# Patient Record
Sex: Female | Born: 1935 | Race: White | Hispanic: No | Marital: Married | Smoking: Never smoker
Health system: Southern US, Community
[De-identification: ages and names within clinical notes are randomized; demographics above are authoritative.]

## PROBLEM LIST (undated history)

## (undated) DIAGNOSIS — I341 Nonrheumatic mitral (valve) prolapse: Secondary | ICD-10-CM

## (undated) DIAGNOSIS — I10 Essential (primary) hypertension: Secondary | ICD-10-CM

## (undated) DIAGNOSIS — H409 Unspecified glaucoma: Secondary | ICD-10-CM

## (undated) HISTORY — PX: APPENDECTOMY: SHX54

## (undated) HISTORY — PX: CHOLECYSTECTOMY: SHX55

## (undated) HISTORY — PX: BREAST EXCISIONAL BIOPSY: SUR124

## (undated) HISTORY — PX: ABDOMINAL HYSTERECTOMY: SHX81

---

## 1965-11-20 HISTORY — PX: THYROIDECTOMY: SHX17

## 1999-02-14 ENCOUNTER — Ambulatory Visit (HOSPITAL_COMMUNITY): Admission: RE | Admit: 1999-02-14 | Discharge: 1999-02-14 | Payer: Self-pay | Admitting: *Deleted

## 1999-12-01 ENCOUNTER — Encounter: Payer: Self-pay | Admitting: Internal Medicine

## 1999-12-01 ENCOUNTER — Encounter: Admission: RE | Admit: 1999-12-01 | Discharge: 1999-12-01 | Payer: Self-pay | Admitting: Internal Medicine

## 2000-12-07 ENCOUNTER — Encounter: Admission: RE | Admit: 2000-12-07 | Discharge: 2000-12-07 | Payer: Self-pay | Admitting: Internal Medicine

## 2000-12-07 ENCOUNTER — Encounter: Payer: Self-pay | Admitting: Internal Medicine

## 2001-10-09 ENCOUNTER — Encounter: Admission: RE | Admit: 2001-10-09 | Discharge: 2001-10-09 | Payer: Self-pay | Admitting: Internal Medicine

## 2001-10-09 ENCOUNTER — Encounter: Payer: Self-pay | Admitting: Internal Medicine

## 2001-12-09 ENCOUNTER — Encounter: Payer: Self-pay | Admitting: Internal Medicine

## 2001-12-09 ENCOUNTER — Encounter: Admission: RE | Admit: 2001-12-09 | Discharge: 2001-12-09 | Payer: Self-pay | Admitting: Internal Medicine

## 2002-12-24 ENCOUNTER — Encounter: Payer: Self-pay | Admitting: Obstetrics and Gynecology

## 2002-12-24 ENCOUNTER — Encounter: Admission: RE | Admit: 2002-12-24 | Discharge: 2002-12-24 | Payer: Self-pay | Admitting: Obstetrics and Gynecology

## 2003-09-09 ENCOUNTER — Inpatient Hospital Stay (HOSPITAL_COMMUNITY): Admission: EM | Admit: 2003-09-09 | Discharge: 2003-09-13 | Payer: Self-pay | Admitting: Emergency Medicine

## 2003-09-09 ENCOUNTER — Encounter (INDEPENDENT_AMBULATORY_CARE_PROVIDER_SITE_OTHER): Payer: Self-pay | Admitting: Cardiology

## 2003-09-09 ENCOUNTER — Encounter: Payer: Self-pay | Admitting: Emergency Medicine

## 2003-09-09 ENCOUNTER — Encounter: Payer: Self-pay | Admitting: Internal Medicine

## 2003-11-27 ENCOUNTER — Encounter: Admission: RE | Admit: 2003-11-27 | Discharge: 2003-11-27 | Payer: Self-pay | Admitting: *Deleted

## 2003-12-28 ENCOUNTER — Encounter: Admission: RE | Admit: 2003-12-28 | Discharge: 2003-12-28 | Payer: Self-pay | Admitting: Internal Medicine

## 2005-01-19 ENCOUNTER — Encounter: Admission: RE | Admit: 2005-01-19 | Discharge: 2005-01-19 | Payer: Self-pay | Admitting: Internal Medicine

## 2005-09-04 ENCOUNTER — Other Ambulatory Visit: Admission: RE | Admit: 2005-09-04 | Discharge: 2005-09-04 | Payer: Self-pay | Admitting: Internal Medicine

## 2005-09-05 ENCOUNTER — Inpatient Hospital Stay (HOSPITAL_COMMUNITY): Admission: EM | Admit: 2005-09-05 | Discharge: 2005-09-07 | Payer: Self-pay | Admitting: Emergency Medicine

## 2005-12-01 ENCOUNTER — Encounter (INDEPENDENT_AMBULATORY_CARE_PROVIDER_SITE_OTHER): Payer: Self-pay | Admitting: Specialist

## 2005-12-01 ENCOUNTER — Ambulatory Visit (HOSPITAL_COMMUNITY): Admission: RE | Admit: 2005-12-01 | Discharge: 2005-12-01 | Payer: Self-pay | Admitting: *Deleted

## 2006-01-23 ENCOUNTER — Encounter: Admission: RE | Admit: 2006-01-23 | Discharge: 2006-01-23 | Payer: Self-pay | Admitting: Internal Medicine

## 2007-02-20 ENCOUNTER — Encounter: Admission: RE | Admit: 2007-02-20 | Discharge: 2007-02-20 | Payer: Self-pay | Admitting: Obstetrics and Gynecology

## 2008-01-09 ENCOUNTER — Ambulatory Visit (HOSPITAL_COMMUNITY): Admission: RE | Admit: 2008-01-09 | Discharge: 2008-01-09 | Payer: Self-pay | Admitting: *Deleted

## 2008-02-07 ENCOUNTER — Encounter: Admission: RE | Admit: 2008-02-07 | Discharge: 2008-02-07 | Payer: Self-pay | Admitting: *Deleted

## 2008-03-09 ENCOUNTER — Encounter: Admission: RE | Admit: 2008-03-09 | Discharge: 2008-03-09 | Payer: Self-pay | Admitting: Internal Medicine

## 2009-03-10 ENCOUNTER — Encounter: Admission: RE | Admit: 2009-03-10 | Discharge: 2009-03-10 | Payer: Self-pay | Admitting: Obstetrics and Gynecology

## 2010-03-11 ENCOUNTER — Encounter: Admission: RE | Admit: 2010-03-11 | Discharge: 2010-03-11 | Payer: Self-pay | Admitting: Internal Medicine

## 2010-03-31 ENCOUNTER — Emergency Department (HOSPITAL_BASED_OUTPATIENT_CLINIC_OR_DEPARTMENT_OTHER): Admission: EM | Admit: 2010-03-31 | Discharge: 2010-03-31 | Payer: Self-pay | Admitting: Emergency Medicine

## 2010-07-27 ENCOUNTER — Encounter: Admission: RE | Admit: 2010-07-27 | Discharge: 2010-07-27 | Payer: Self-pay | Admitting: Gastroenterology

## 2011-01-26 ENCOUNTER — Other Ambulatory Visit: Payer: Self-pay | Admitting: Internal Medicine

## 2011-01-26 DIAGNOSIS — R0989 Other specified symptoms and signs involving the circulatory and respiratory systems: Secondary | ICD-10-CM

## 2011-01-27 ENCOUNTER — Ambulatory Visit
Admission: RE | Admit: 2011-01-27 | Discharge: 2011-01-27 | Disposition: A | Discharge status: Home / Self Care | Payer: Medicare Other | Source: Ambulatory Visit | Attending: Internal Medicine | Admitting: Internal Medicine

## 2011-01-27 DIAGNOSIS — R0989 Other specified symptoms and signs involving the circulatory and respiratory systems: Secondary | ICD-10-CM

## 2011-02-06 ENCOUNTER — Other Ambulatory Visit: Payer: Self-pay | Admitting: Internal Medicine

## 2011-02-06 DIAGNOSIS — Z1231 Encounter for screening mammogram for malignant neoplasm of breast: Secondary | ICD-10-CM

## 2011-03-14 ENCOUNTER — Ambulatory Visit
Admission: RE | Admit: 2011-03-14 | Discharge: 2011-03-14 | Disposition: A | Discharge status: Home / Self Care | Payer: Medicare Other | Source: Ambulatory Visit | Attending: Internal Medicine | Admitting: Internal Medicine

## 2011-03-14 DIAGNOSIS — Z1231 Encounter for screening mammogram for malignant neoplasm of breast: Secondary | ICD-10-CM

## 2011-04-04 NOTE — Op Note (Signed)
NAMEPHILISHA, WEINEL NO.:  [id number]   MEDICAL RECORD NO.:  [id number]          PATIENT TYPE:  AMB   LOCATION:  ENDO                         FACILITY:  [facility]   PHYSICIAN:  Georgiana Spinner, M.D.    DATE OF BIRTH:  02/18/1931   DATE OF PROCEDURE:  01/09/2008  DATE OF DISCHARGE:                               OPERATIVE REPORT   PROCEDURE:  Colonoscopy attempt.   ANESTHESIA:  Fentanyl 75 mcg, Versed 6 mg.   PROCEDURE:  With the patient mildly sedated in the left lateral  decubitus position, the Pentax videoscopic colonoscope was inserted the  rectum and passed under direct vision to the sigmoid colon and at the  turn where the colon left the pelvis I could not make the turn so I  withdrew the colonoscope and inserted a pediatric colonoscope was which  went to the same level.  I elected to withdraw taking circumferential  views remaining colonic mucosa.  The endoscope was placed in  retroflexion to view the anal canal from above.  Internal hemorrhoids  were seen.  The endoscope was straightened, withdrawn.  The patient's  vital signs, pulse oximeter remained stable.  The patient tolerated  procedure well without apparent complication.   FINDINGS:  Internal hemorrhoids otherwise unremarkable examination.   PLAN:  Proceed to barium enema for complete evaluation.           ______________________________  Georgiana Spinner, M.D.     GMO/MEDQ  D:  01/09/2008  T:  01/10/2008  Job:  [id number]

## 2011-04-07 NOTE — Consult Note (Signed)
NAME:  Tammy Hampton, Tammy Hampton                       ACCOUNT NO.:  [id number]   MEDICAL RECORD NO.:  [id number]                   PATIENT TYPE:  INP   LOCATION:  5502                                 FACILITY:  [facility]   PHYSICIAN:  Doylene Canning. Ladona Ridgel, M.D.               DATE OF BIRTH:  09-28-1931   DATE OF CONSULTATION:  09/10/2003  DATE OF DISCHARGE:                                   CONSULTATION   ELECTROPHYSIOLOGY CONSULT NOTE   REASON FOR CONSULTATION:  The consultation was requested by Dr. Miles Costain  for evaluation of syncope.   HISTORY OF PRESENT ILLNESS:  The patient is a very pleasant 77 year old  woman with a history of remote syncope in the past, a history of mitral  valve prolapse, who was admitted to the hospital with complaints of  headache, chills, and chest pain.  The patient had no evidence of myocardial  injury on serial cardiac enzymes.  She has baseline left bundle branch  block.  She had been fairly stable with resolution of her chest pain, but  continuation of the fevers and chills with a CT scan of the head showing  sinusitis by report.  She was up at the sink last night when she turned to  walk back to her room and passed out.  Apparently she was unconscious for  several minutes.  There was minimal, if any, warning.  After the episode,  she felt weak and tired.  She denied any associated chest pain, shortness of  breath, diaphoresis, nausea, or vomiting.  She was referred for additional  evaluation.   PAST MEDICAL HISTORY:  As previously noted.  In addition to the history of  glaucoma, she is status post thyroidectomy for thyroid cancer several years  ago, and is now on Synthroid replacement.  She has had a history of a  thallium stress test several years ago.  Apparently all of these were  normal.   PAST SURGICAL HISTORY:  1. Thyroidectomy.  2. Appendectomy.  3. Hysterectomy.  4. Cholecystectomy.  5. She is status post repair of rectocele and a cystocele.   FAMILY HISTORY:  Notable for glaucoma and diabetes.   SOCIAL HISTORY:  The patient is married but has no children.  She denies  tobacco or ethanol use.   PHYSICAL EXAMINATION:  GENERAL:  She is a pleasant, well-appearing 72-year-  old woman in no distress.  VITAL SIGNS:  Blood pressure was 119/55, pulse 81 and regular, temperature  98.7, respirations 18.  HEENT:  Normocephalic and atraumatic.  Pupils were equal and round.  Oropharynx was moist.  The sclerae are anicteric.  NECK:  No jugular venous distention.  There was no thyromegaly.  The trachea  was midline.  There were no bruits.  CARDIOVASCULAR:  Regular rate and rhythm with normal S1 and S2.  There were  no murmurs, rubs, or gallops.  LUNGS:  Demonstrated no wheezing, rales, or rhonchi  bilaterally.  ABDOMEN:  Soft, nontender, nondistended.  There was no organomegaly.  NEUROLOGIC:  She is alert an oriented x3 with cranial nerves II-XII grossly  intact.  EXTREMITIES:  Demonstrated 1 to 2+ pulses bilaterally without edema.   LABORATORY AND ACCESSORY DATA:  EKG demonstrates normal sinus rhythm with a  short PR interval and left bundle branch block.  Labs demonstrated no  myocardial injury with a normal TSH.   IMPRESSION:  1. Unexplained syncope.  2. Left bundle branch block.  3. Chest pain of unknown etiology.  4. Headache with evidence of sinusitis by CT scan of the head.   DISCUSSION:  I have reviewed the patient's rhythm strips while she had her  syncopal episode, and there was no brady or tachyarrhythmias noted.  This  strongly suggests an orthostatic mechanism to explain her syncope.  I have  recommended that she continue to be monitored on telemetry.  I have also  recommended that orthostatic vitals be recorded several times a day (q  shift).  Would consider Cardiolite stress testing, unless this has been done  recently, although I do not think coronary disease is the cause of her  syncope.  I wonder if somehow  sinusitis might involve her vestibular system,  resulting in dizziness, followed by syncope, although this seems unlikely.  Head up tilt table testing would be a consideration, particularly in light  of all of her above symptoms, particularly if the left ventricular function  is normal.  Will follow up with the results of this.                                               Doylene Canning. Ladona Ridgel, M.D.    GWT/MEDQ  D:  09/10/2003  T:  09/10/2003  Job:  [id number]   cc:   Miles Costain, M.D.   Jaclyn Prime. Lucas Mallow, M.D.  [address]  [place]  [place] [address]  Fax: [phone]

## 2011-04-07 NOTE — Op Note (Signed)
NAMEMarland Kitchen  Tammy Hampton, CHIUSANO NO.:  [id number]   MEDICAL RECORD NO.:  [id number]          PATIENT TYPE:  AMB   LOCATION:  ENDO                         FACILITY:  [facility]   PHYSICIAN:  Georgiana Spinner, M.D.    DATE OF BIRTH:  [birth year]   DATE OF PROCEDURE:  DATE OF DISCHARGE:                                 OPERATIVE REPORT   PROCEDURE:  Colonoscopy with polypectomy.   INDICATIONS:  Colon polyps.   ANESTHESIA:  Demerol 50, Versed 4 mg.   PROCEDURE:  With the patient mildly sedated in the left lateral decubitus  position, the Olympus videoscopic pediatric colonoscope, PCF160, was  inserted into the rectum and passed through a tight, tortuous sigmoid colon.  Subsequently with pressure applied to the abdomen, we were able to reach the  cecum, identified by the ileocecal valve and the appendiceal orifice, both  of which were photographed.  From this point, the colonoscope was slowly  withdrawn, taking circumferential views of the colonic mucosa, as we  withdrew to the rectum, stopping only in the ascending colon, where a small  polyp was seen, photographed, and removed.  Using snare cautery technique at  a setting of 20/20 blending current,  the tissue was retrieved for  pathology.  The endoscope was withdrawn.  __________ to the rectum, which  appeared normal on direct and showed on retroflexed view.  The endoscope was  straightened and withdrawn.  Patient's vital signs and pulse oximetry  remained stable.  Patient tolerated the procedure well without apparent  complications.   FINDINGS:  Internal hemorrhoids and polyp of the ascending colon, otherwise  unremarkable examination.   PLAN:  Await biopsy report.  Patient will call me for results and follow up  with me as an outpatient.           ______________________________  Georgiana Spinner, M.D.     GMO/MEDQ  D:  12/01/2005  T:  12/01/2005  Job:  [id number]

## 2011-04-07 NOTE — H&P (Signed)
NAME:  Tammy Hampton NO.:  [id number]   MEDICAL RECORD NO.:  [id number]                   PATIENT TYPE:  EMS   LOCATION:  MAJO                                 FACILITY:  [facility]   PHYSICIAN:  Sarita Bottom, M.D.                  DATE OF BIRTH:  [birth year]   DATE OF ADMISSION:  09/08/2003  DATE OF DISCHARGE:                                HISTORY & PHYSICAL   PRIMARY CARE PHYSICIAN:  Dr. Lucas Mallow   CHIEF COMPLAINT:  Headache and chest pain.   HISTORY OF PRESENT ILLNESS:  [name] is a 77 year old lady with  a history of mitral valve prolapse and glaucoma.  She was well until this  evening when she noticed left-sided chest pain.  She said the pain is not  related to exertion and is non radiating.  However the pain is associated  with nausea and palpitations.  She also has a bilateral frontal headache,  onset this evening.  She decided to come to the emergency room to have these  symptoms evaluated.   REVIEW OF SYSTEMS:  CONSTITUTIONAL:  She denies any fever but admits to  chills on and off.  Denies any cough.  She says she feels short of breath  sometimes.  She has no palpitations at the moment.  GU:  She has symptoms of  urinary frequency and currently is being treated with nitrofurantoin for a  bladder infection.  GI:  Denies any vomiting, no diarrhea, appetite is fair.  CNS:  She admits to headaches but denies any dizziness.  EXTREMITIES:  Denies any pain or any pedal edema.   PAST MEDICAL HISTORY:  1. History of mitral valve prolapse.  2. History of glaucoma.  3. Status post thyroidectomy for a thyroid cancer several years ago.  4. She is currently being treated for a bladder infection with     Nitrofurantoin.  5. She had a thallium stress- test done about four years ago and states the     results were normal.   PAST SURGICAL HISTORY:  Significant for thyroidectomy, appendectomy, a  hysterectomy, cholecystectomy, rectocele and  cystocele repair.   ALLERGIES:  She claims to be allergic to PENICILLIN which gives her a rash.   FAMILY HISTORY:  She has a brother and a sister, both with diabetes.  She  has several other family members with glaucoma.   SOCIAL HISTORY:  She is married, she has no children.  She does not smoke or  drink alcohol.  She is currently a housewife.   PHYSICAL EXAMINATION:  VITAL SIGNS:  Blood pressure is 119/55, heart rate is  81, temperature is 98.7 degrees Fahrenheit.  GENERAL:  She is an elderly lady lying comfortably on a gurney in the  emergency department in mild painful distress.  HEENT:  She is not pale.  Anicteric.  Oral mucosa is moist.  Pupils are  equal and reactive  to light and accommodation.  NECK:  Supple, she has no lymphadenopathy, no thyromegaly, no jugular venous  distension.  CHEST:  A&P is good bilaterally.  Breath sounds are fascicular, no crackles  or wheezes are appreciated.  CARDIOVASCULAR:  Heart sounds S1, S2 normal.  Rhythm appears regular.  She  has a grade 2/6 murmur heard best at the apex, non radiating.  ABDOMEN:  Soft, bowel sounds are present.  NEUROLOGIC:  She is alert and oriented x3.  She has no gross focal  neurologic deficits.  EXTREMITIES:  She has no pedal edema.  Pulses are 2+ bilaterally, no  clubbing or cyanosis.   LABS AND DIAGNOSTICS:  EKG shows a sinus rhythm at 92 beats per minute.  Left axis deviation, left bundle branch block.  She has some T wave  inversion in lead I, aVL and V6.  No old EKG is available for comparison.  Sodium is 136, potassium is 3.5, chloride is 105, CO2 is 22, BUN is 11,  creatinine is 0.9, glucose is 172.  Anion gap is 13.  Hemoglobin is 13,  hematocrit is 38.  CK-MB - the first set is less than one, second set is  also less than one.  Both sets of troponins are less than 0.05.  The pH is  7.51, PCO2 is 26.5, bicarb is 21.   ASSESSMENT AND PLAN:  1. Chest pain in an elderly patient with a history of mitral  valve prolapse.     An EKG shows changes of a left bundle branch block.  The patient will be     admitted to telemetry and acute myocardial infarction will be ruled out     with serial cardiac enzymes and EKGs.  She will be put on aspirin,     nitroglycerin and will be continued on nadolol which is a beta blocker.     She also will be given oxygen and serial cardiac enzymes and EKGs will be     done.  A 2D echocardiogram will also be ordered.  2. Mitral valve prolapse.  A 2D echocardiogram will be done as noted above.  3. Headache.  The patient will be given Tylenol 650 p.r.n.  4. With a history of hypothyroidism patient will be continued on Synthroid,     TSH will be checked.  5. With a history of glaucoma patient will be resumed on her Cosopt and her     Xalatan eye drops.  6. Elevated blood glucose.  Will go ahead and check a hemoglobin A1C and     also check the patient's fasting blood sugar.  7. With a history of recurrent urinary tract infections for which she is     being treated with nitrofurantoin, the patient will be continued on this     medication.   The patient will be admitted under the care of [facility]  service.  Further work up and management will depend on her clinical course.                                                 Sarita Bottom, M.D.    DW/MEDQ  D:  09/09/2003  T:  09/09/2003  Job:  [id number]   cc:   Jaclyn Prime. Lucas Mallow, M.D.  [address]  [place]  [place] [address]  Fax: [phone]

## 2011-04-07 NOTE — Discharge Summary (Signed)
NAME:  Tammy Hampton, Tammy Hampton                       ACCOUNT NO.:  [id number]   MEDICAL RECORD NO.:  [id number]                   PATIENT TYPE:  INP   LOCATION:  5502                                 FACILITY:  [facility]   PHYSICIAN:  Alfonse Spruce, M.D.               DATE OF BIRTH:  01-04-1931   DATE OF ADMISSION:  09/09/2003  DATE OF DISCHARGE:  09/13/2003                                 DISCHARGE SUMMARY   FINAL DIAGNOSES:  1. Atypical chest pain.  2. Symptomatic bradycardia with syncope, adjusted medication, Corgard, with     the effect of some resolution as well as adding the Corgard 40 has been     decreased to 20 mg.  3. History of urinary tract infection prior to the admission.  4. Left bundle branch block with diastolic dysfunction.  5. Left popliteal fossa Baker's cyst with negative deep vein thrombosis and     normal D-dimer.  6. Sinusitis, maxillary and ethmoid, treated with antibiotics.  7. Left knee degenerative arthritis.   CONSULTATIONS:  Cardiology, Dr. Aggie Cosier and Dr. Donnie Aho,  electrophysiologic study.   HOSPITAL COURSE:  The patient did well with the treatment for the sinusitis  as initial complaint that she had tightness of the forehead and she did not  have no syncopal on admission;  however, she had bradycardia.  Subsequently  during her hospital stay, she had a syncopal episode with the event of  bradycardia which was symptomatic and with the effect of eye drops and the  Timolol and Corgard.  Her rate was on the telemetry monitor in the 40s.  Corgard has been decreased to 20 mg.  The patient is ambulatory, has no  evidence of complaints.  Has no dizziness, no syncopal episode, feeling  good.  Her vital signs are stable with blood pressures today of 118/62,  pulse 50 to 60, respiratory rate was 20.  Her laboratory indicating a white  count of 6.2, hemoglobin 13.6, hematocrit 38.5, and platelets 219.  No  evidence of anemia.  Magnesium is 2.4.  Her calcium is  8.9.  Her hemoglobin  A1C 5.3.  Her electrolytes were normal with a potassium of 3.6, sodium 141.  Her BUN 17, creatinine 0.9, and blood sugar 123.  Her liver function tests  was normal, as well as albumin and total protein.  Her physical examination  on discharge was essentially negative and no symptoms, and she had a left  Baker's cyst and she had a left knee degenerative arthritis.  The patient  advised to be seen by Dr. Lendell Caprice, the PCP, who has been working on her  left knee evaluation and treatment, as well as may be seen by orthopaedic  through Dr. Lendell Caprice.  Also, she will be followed by Dr. Aggie Cosier for any  further evaluation cardiac wise as an outpatient.   She is stable currently, and she will be discharged on her current  medications of aspirin 325 mg tablet enteric coated q. daily, Synthroid 100  mcg tablets q. daily, Zocor 40 mg q. daily, Avelox 400 mg q. daily for 10  days for the sinusitis, and Corgard 20 mg p.o. q. daily, Cosopt ophthalmic  solution as well as ___________, she has the drops at home.  She will be on  a cardiac diet, and will be followed by Dr. Lendell Caprice and Dr. Lucas Mallow as an  outpatient.  The patient is currently stable.   PROGNOSIS:  Good to fair.                                               Alfonse Spruce, M.D.   Wynn Maudlin  D:  09/13/2003  T:  09/13/2003  Job:  [id number]

## 2011-04-07 NOTE — Discharge Summary (Signed)
NAMEBIRDA, DIDONATO NO.:  [id number]   MEDICAL RECORD NO.:  [id number]            PATIENT TYPE:   LOCATION:                                 FACILITY:   PHYSICIAN:  Michaelyn Barter, M.D.      DATE OF BIRTH:   DATE OF ADMISSION:  09/06/2005  DATE OF DISCHARGE:  09/07/2005                                 DISCHARGE SUMMARY   PRIMARY CARE PHYSICIAN:  Dimas Alexandria, M.D.   FINAL DIAGNOSES AT TIME OF DISCHARGE:  1.  Urinary tract infection in a patient who failed outpatient treatment.  2.  Sinusitis.   SECONDARY DIAGNOSIS:  Hypothyroidism.   HISTORY OF PRESENT ILLNESS:  [name] is a 77 year old female who arrived  in the emergency room with the chief complaint of bladder infection.  She  stated that she had seen her primary care physician, Dr. Dimas Alexandria,  the day prior to admission and at that time was treated for a urinary tract  infection with nitrofurantoin.  However, over the course of the night, she  developed severe chills, felt extremely cold, and as though her entire body  was jerking.  She developed a severe headache and decided to come to the  hospital for further evaluation.  She also complained of her heart beat  racing and felt vaguely short of breath.   PAST MEDICAL HISTORY:  Please see HPI for past medical history and past  surgical history.   FAMILY HISTORY:  Mother had a history of colon cancer, diagnosed at the age  of 97.  Died from Alzheimer's dementia at the age of 75.  Father had a heart  attack and died at the age of 72.   SOCIAL HISTORY:  Cigarettes, the patient denies.  Alcohol, the patient  denies.   HOSPITAL COURSE:  1.  URINARY TRACT INFECTION:  When the patient presented to the emergency      room, she was febrile with a temperature of 101.4.  A urinalysis was      completed and was positive for a UTI.  Wbc's were noted to be 21 to 50.      Leukocytes were large.  The patient was subsequently started on empiric    IV antibiotics consisting of ciprofloxacin.  A urine culture was      completed x2 over the course of her hospitalization and was found to be      negative.  Over the course of her hospitalization, the patient stated      that she felt better.  She appeared to respond to the antibiotics.  She      denied having any additional chills over the course of her      hospitalization and, likewise, her fevers resolved.  In addition, the      patient was noted to have a leukocytosis when she initially presented to      the hospital with a white blood cell count of 18.3.  This was followed      over the course of her hospitalization, and by the date of discharge,  her leukocytosis resolved with a white blood cell count of 5.7.   1.  SINUS PROBLEMS/SINUSITIS:  The patient stated that she felt congestion      within her nasal sinuses during the course of her hospitalization and      also complained of a discharge.  Today, she states that her sinuses are      very irritating and she has been itching a lot.  She requested something      for her sinuses, and she has been prescribed medication for this.   1.  ABDOMINAL DISCOMFORT:  On the night of September 06, 2005, the patient      complained of some abdominal discomfort, and as a result, abdominal x-      rays were completed.  The final impression of the abdominal x-rays is      small bilateral pleural effusions with bibasilar atelectasis and      interstitial edema.  Negative for free air or obstruction with moderate      amount of constipation.  The radiologist stated that there was a      moderately large volume of stool seen in the right colon.  No unexpected      abdominal calcifications were seen.  There was no free intraperitoneal      air.   CONDITION ON DISCHARGE:  Improved.  The patient states that she feels much  better.  She also states that she is ready to be discharged home, although  she requests medication for her sinuses.    DISPOSITION:  The decision has been made to discharge the patient home.   DISCHARGE MEDICATIONS:  1.  Ciprofloxacin 250 mg p.o. twice a day.  2.  Protonix 40 mg q.d.  3.  Nasonex 2 sprays per nostril once a day.   FOLLOWUP:  The patient has been instructed to follow up with her primary  care physician, Dr. Dimas Alexandria, within 1 to 2 weeks and to take all of  her medications as prescribed.      Michaelyn Barter, M.D.  Electronically Signed     OR/MEDQ  D:  09/07/2005  T:  09/07/2005  Job:  [id number]   cc:   Janae Bridgeman. Eloise Harman., M.D.  Fax: [phone]

## 2011-04-07 NOTE — H&P (Signed)
NAMEMarland Hampton  YATZIL, CLIPPINGER NO.:  [id number]   MEDICAL RECORD NO.:  [id number]          PATIENT TYPE:  EMS   LOCATION:  [facility]                         FACILITY:  [facility]   PHYSICIAN:  Michaelyn Barter, M.D. DATE OF BIRTH:  10-15-1931   DATE OF ADMISSION:  09/05/2005  DATE OF DISCHARGE:                                HISTORY & PHYSICAL   PRIMARY CARE PHYSICIAN:  Janae Bridgeman. Lendell Caprice, M.D.   CHIEF COMPLAINT:  Bladder infection .   HISTORY OF PRESENT ILLNESS:  [name] is a 77 year old female with past  medical history of hypothyroidism, who states that approximately 2-3 weeks  ago she began  developing  increased urinary frequency.  She states that she  went to see her primary care physician, Dr. Dimas Alexandria, yesterday for  a physical examination.  At that time she was discovered to have had a  urinary tract infection.  She was started on empiric antibiotic p.o.,  consistent of nitrofurantoin; however, she states that last night she went  on to develop severe chills, during which felt extremely cold and felt as  though her entire body was jerking.  Likewise, she developed a severe  headache and began to feel extremely weak; therefore decided to come to the  hospital for further evaluation.  In addition she complained of her heart  racing and stated that she vaguely felt short of breath.   PAST MEDICAL HISTORY:  1.  Atypical chest pain.  2.  Bradycardia with syncope, associated with medications.  3.  History of urinary tract infections.  4.  Left bundle branch block with diastolic dysfunction.  5.  Left popliteal fossa Baker's cyst.  6.  Sinusitis, involving maxillary and ethmoid sinuses.  7.  Left knee degenerative arthritis.  8.  Hypothyroidism.  9.  Glaucoma.  10. History of thyroid cancer.   PAST SURGICAL HISTORY:  1.  Thyroidectomy.  2.  Appendectomy, secondary to appendicitis.  3.  Hysterectomy, secondary to heavy menstrual bleeding.  4.   Cholecystectomy.  5.  Repair of rectocele and cystocele.  6.  Benign lumps removed from both breasts.   ALLERGIES:  PENICILLIN (produces itching and causes her to turn red).   HOME MEDICATIONS:  1.  Lisinopril 20 mg p.o. q.d.  2.  Synthroid 100 mcg p.o. q.d.  3.  Nitrofurantoin 100 mg p.o. b.i.d. (which was just started yesterday).  4.  Xalatan 0.005% one drop into each eye at bedtime.  5.  Cozaar.   FAMILY HISTORY:  Mother had a history of colon cancer, diagnosed at the age  of 78; she lived to be age 60, and she also died from Alzheimer's disease.  Father had a heart attack at the age of 22.   SOCIAL HISTORY:  Cigarettes the patient denies.  Alcohol the patient denies.   REVIEW OF SYSTEMS:  As per HPI, otherwise all other systems are negative.   PHYSICAL EXAMINATION:  GENERAL:  The patient is awake; she is cooperative.  She appears to be weak.  She shows no signs of respiratory distress or  compromise, nor does she display any obvious other  signs of distress.  VITALS: (At time of admission)  Temperature 101.4, heart rate 84,  respirations 20, O2 saturation 96%.  HEENT:  Anicteric.  Extraocular movements are intact.  Oral mucosa is pink,  moist; no exudates, no thrush present.  NECK:  Supple; no lymphadenopathy. Thyroid is not palpable.  CARDIAC:  S1, S2 is present; regular rate and rhythm.  No S3, S4.  No  murmur, gallop or rubs.  RESPIRATORY:  Clear.  No crackles or wheezes.  ABDOMEN:  Soft, flat, nontender and nondistended.  Positive bowel sounds x4  quadrants.  EXTREMITIES:  No edema.  NEUROLOGIC:  The patient is alert and oriented x3.  Cranial nerves II-XII  are intact.  MUSCULOSKELETAL:  There is 5/5 upper and lower extremity strength.   LABORATORY STUDIES:  White count 18.3, hemoglobin 12.5, hematocrit 36.1,  platelets 199.  Sodium 135, potassium 3.7, chloride 105, CO2 23, BUN 14,  creatinine 1.0, glucose 181.  Total bilirubin 1.0.  Calcium 8.5, alkaline  phosphatase  54, SGOT 32, SGPT 25, total protein 6.1, albumin 3.6.  Urinalysis:  WBCs are 21-50, leukocytes are large, nitrites negative.   ASSESSMENT AND PLAN:  [name] is a 77 year old female recently  started on p.o. antibiotics, as treatment for a urinary tract infection; who  subsequently failed outpatient therapy and therefore is here for evaluation  and treatment of urinary tract infection.  1.  URINARY TRACT INFECTION.  Will start IV ciprofloxacin.  Will check a      urine culture and provide aggressive IV fluid hydration.  2.  LEUKOCYTOSIS.  This is more than likely secondary to the urinary tract      infection.  Again, we will start empiric IV antibiotics for now.  3.  HYPOTHYROIDISM.  Will check TSH, T4; and continue the patient's      previously prescribed  dosage of Synthroid for now.  4.  DEEP VEIN THROMBOSIS PROPHYLAXIS.  Will provide Lovenox.  5.  GASTROINTESTINAL PROPHYLAXIS.  Will provide Protonix.      Michaelyn Barter, M.D.  Electronically Signed     OR/MEDQ  D:  09/05/2005  T:  09/05/2005  Job:  [id number]

## 2011-09-09 IMAGING — US US CAROTID DUPLEX BILAT
1 series · 13 of 24 positions shown · non-contrast
Comparison: None.

CLINICAL DATA: Asymptomatic carotid bruits.  History of
hypertension and mitral valve prolapse. Tinnitus in the right ear.

BILATERAL CAROTID DUPLEX ULTRASOUND 01/27/2011:
TECHNIQUE: Gray scale imaging, color Doppler and duplex ultrasound
was performed of bilateral carotid and vertebral arteries in the
neck.

[Series 1: us carotid duplex bilat · 0.07mm/px · 13 of 59 slices shown]
[im 1/59]
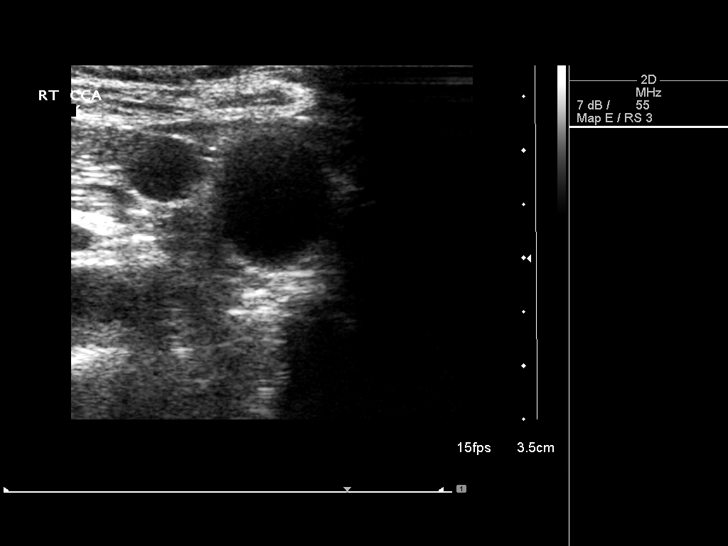
[im 6/59]
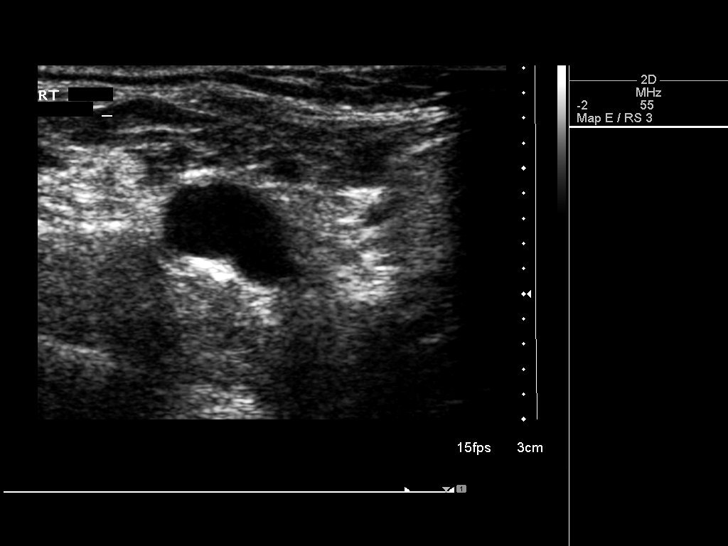
[im 11/59]
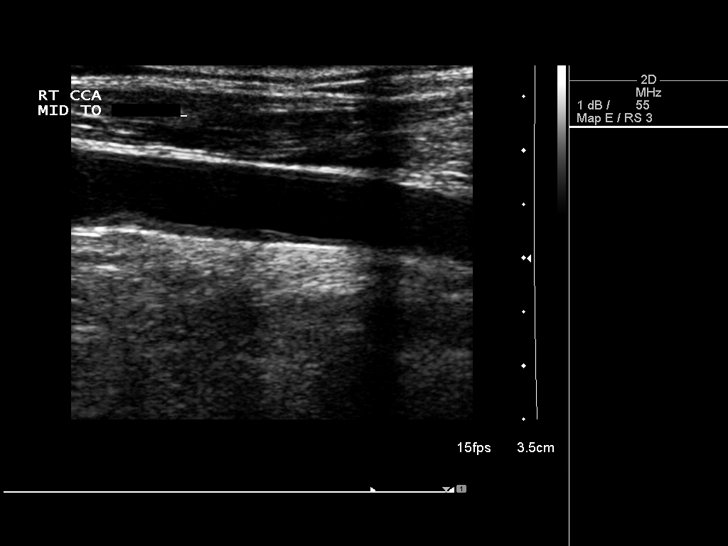
[im 16/59]
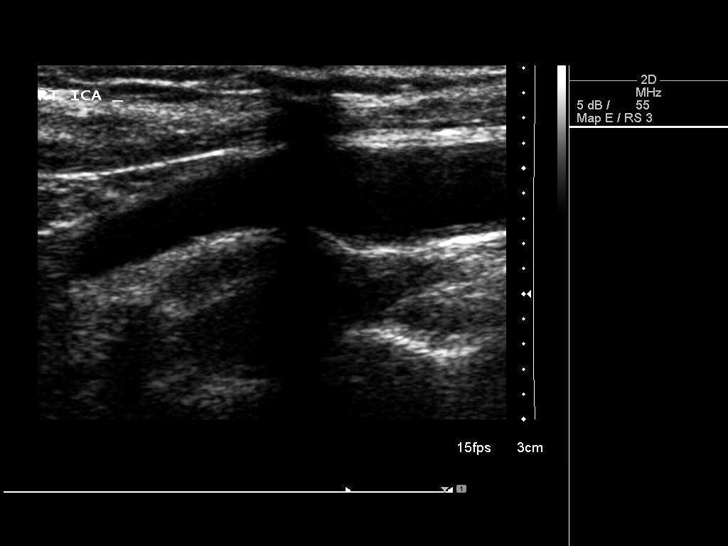
[im 21/59]
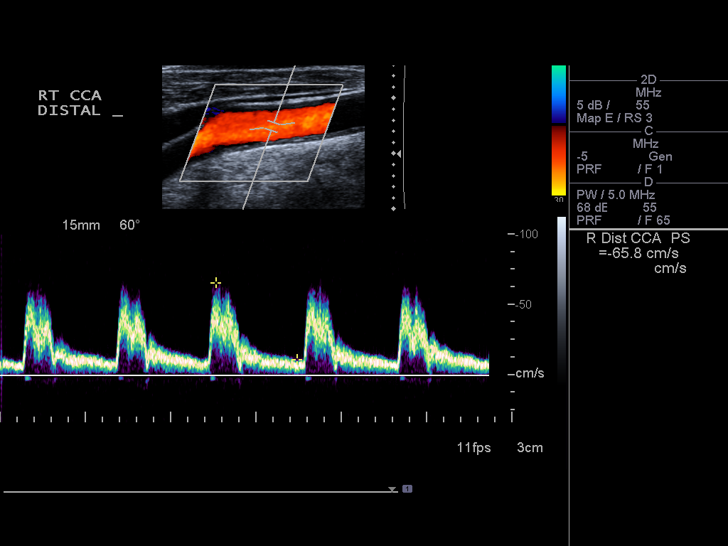
[im 26/59]
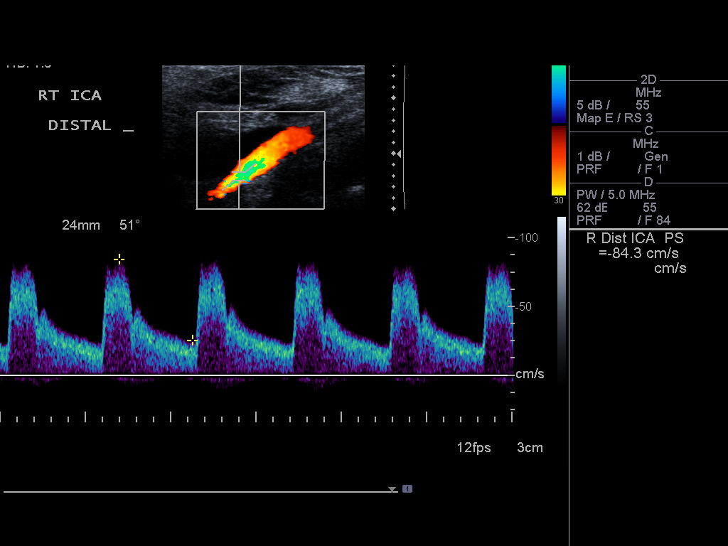
[im 31/59]
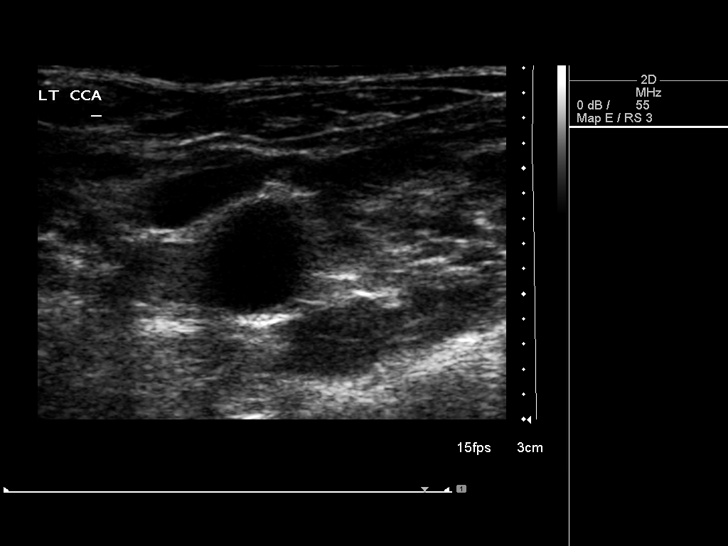
[im 33/59]
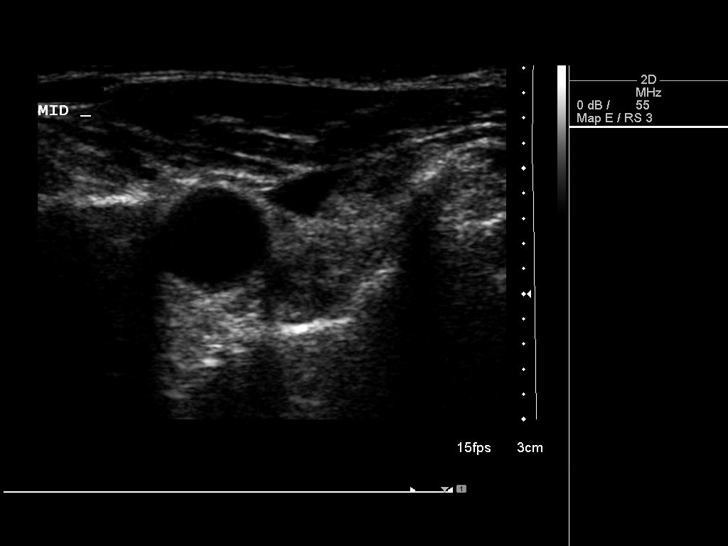
[im 38/59]
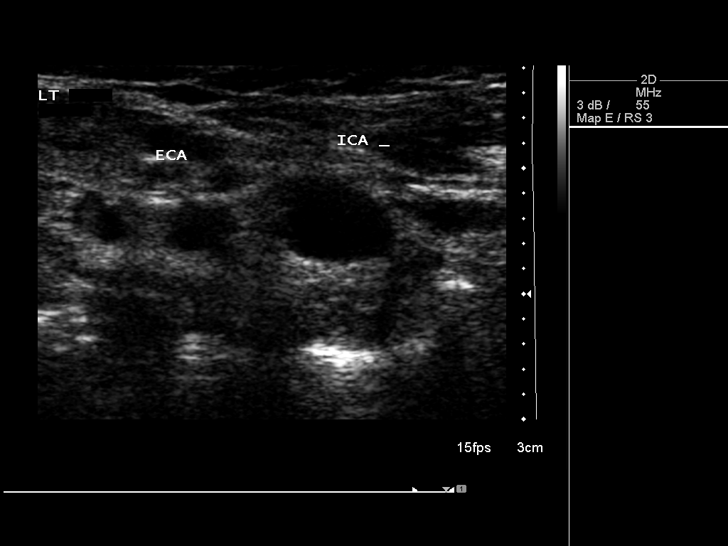
[im 43/59]
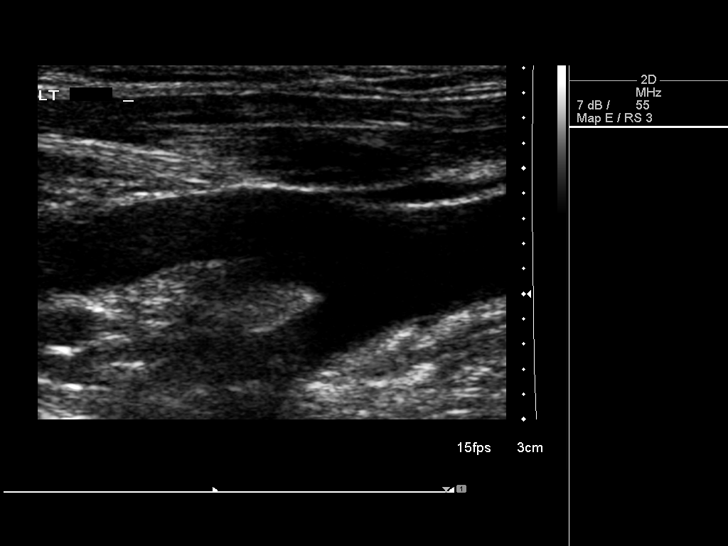
[im 48/59]
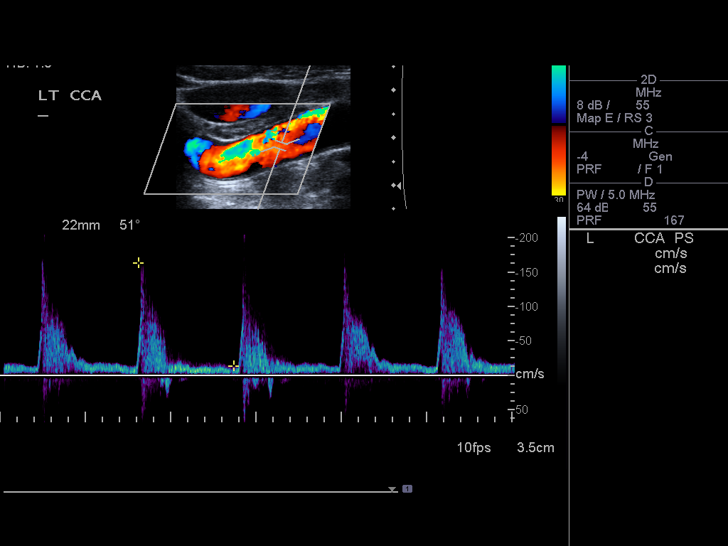
[im 53/59]
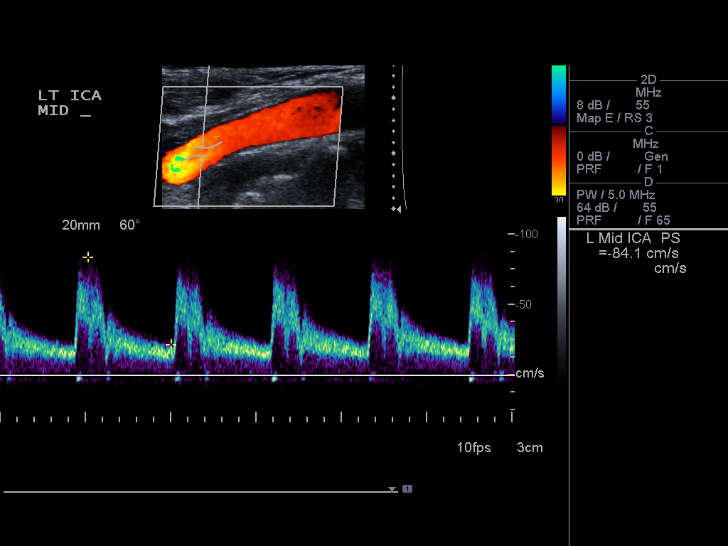
[im 59/59]
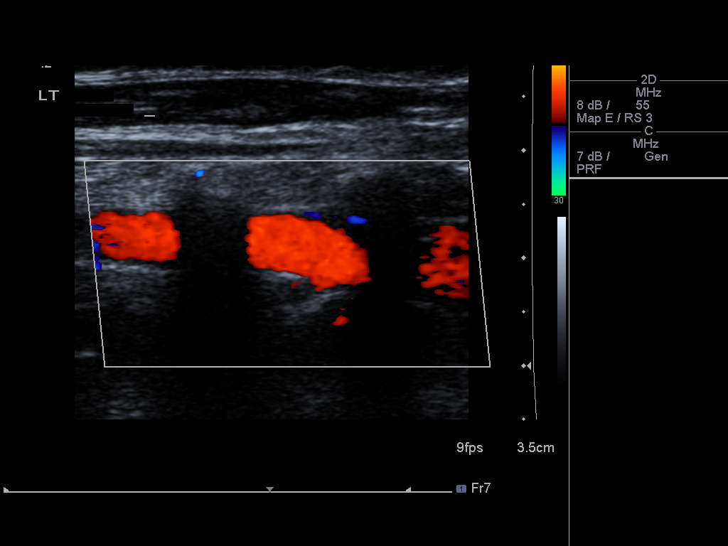

[13 of 24 positions shown; findings below may reference images not displayed]

Criteria:  Quantification of carotid stenosis is based on velocity
parameters that correlate the residual internal carotid diameter
with NASCET-based stenosis levels, using the diameter of the distal
internal carotid lumen as the denominator for stenosis measurement.

The following velocity measurements were obtained:

                 PEAK SYSTOLIC/END DIASTOLIC
RIGHT
ICA:                        99/27cm/sec
CCA:                        129/13cm/sec
SYSTOLIC ICA/CCA RATIO:
DIASTOLIC ICA/CCA RATIO:
ECA:                        65/4cm/sec

LEFT
ICA:                        84/22cm/sec
CCA:                        164/38cm/sec
SYSTOLIC ICA/CCA RATIO:
DIASTOLIC ICA/CCA RATIO:
ECA:                        43/2cm/sec
FINDINGS: RIGHT CAROTID ARTERY: Mild intimal thickening involving the CCA and
ICA.  No significant calcified or noncalcified plaque in the right
carotid circulation.  No visible stenosis of 50% or greater at gray
scale or color imaging.  Mildly tortuous ICA and ECA.

RIGHT VERTEBRAL ARTERY:  Antegrade flow with normal waveform.

LEFT CAROTID ARTERY: Mild intimal thickening involving the CCA and
ICA.  No significant calcified or noncalcified plaque in the left
carotid circulation.  No visible stenosis of 50% or greater at gray
scale or color imaging.  Mildly tortuous ICA and ECA.

LEFT VERTEBRAL ARTERY:  Antegrade flow with normal waveform.
IMPRESSION: 1.  No evidence of hemodynamically significant stenosis involving
either the right to left carotid circulation in the neck by Doppler
criteria.
2.  No significant calcified or noncalcified plaque involving
either the right or left carotid circulation in the neck.
3.  Antegrade flow in both vertebral arteries.

## 2011-12-19 DIAGNOSIS — H04129 Dry eye syndrome of unspecified lacrimal gland: Secondary | ICD-10-CM | POA: Diagnosis not present

## 2011-12-19 DIAGNOSIS — H4011X0 Primary open-angle glaucoma, stage unspecified: Secondary | ICD-10-CM | POA: Diagnosis not present

## 2011-12-28 DIAGNOSIS — M7120 Synovial cyst of popliteal space [Baker], unspecified knee: Secondary | ICD-10-CM | POA: Diagnosis not present

## 2011-12-28 DIAGNOSIS — M25569 Pain in unspecified knee: Secondary | ICD-10-CM | POA: Diagnosis not present

## 2012-01-08 DIAGNOSIS — R51 Headache: Secondary | ICD-10-CM | POA: Diagnosis not present

## 2012-01-09 DIAGNOSIS — R51 Headache: Secondary | ICD-10-CM | POA: Diagnosis not present

## 2012-01-17 DIAGNOSIS — R7309 Other abnormal glucose: Secondary | ICD-10-CM | POA: Diagnosis not present

## 2012-01-17 DIAGNOSIS — E039 Hypothyroidism, unspecified: Secondary | ICD-10-CM | POA: Diagnosis not present

## 2012-01-17 DIAGNOSIS — Z79899 Other long term (current) drug therapy: Secondary | ICD-10-CM | POA: Diagnosis not present

## 2012-01-17 DIAGNOSIS — E785 Hyperlipidemia, unspecified: Secondary | ICD-10-CM | POA: Diagnosis not present

## 2012-01-24 DIAGNOSIS — I10 Essential (primary) hypertension: Secondary | ICD-10-CM | POA: Diagnosis not present

## 2012-01-24 DIAGNOSIS — R7309 Other abnormal glucose: Secondary | ICD-10-CM | POA: Diagnosis not present

## 2012-01-24 DIAGNOSIS — E039 Hypothyroidism, unspecified: Secondary | ICD-10-CM | POA: Diagnosis not present

## 2012-01-24 DIAGNOSIS — E7800 Pure hypercholesterolemia, unspecified: Secondary | ICD-10-CM | POA: Diagnosis not present

## 2012-02-08 ENCOUNTER — Other Ambulatory Visit: Payer: Self-pay | Admitting: Internal Medicine

## 2012-02-08 DIAGNOSIS — Z1231 Encounter for screening mammogram for malignant neoplasm of breast: Secondary | ICD-10-CM

## 2012-03-14 ENCOUNTER — Ambulatory Visit: Payer: Medicare Other

## 2012-03-14 ENCOUNTER — Ambulatory Visit
Admission: RE | Admit: 2012-03-14 | Discharge: 2012-03-14 | Disposition: A | Discharge status: Home / Self Care | Payer: Medicare Other | Source: Ambulatory Visit | Attending: Internal Medicine | Admitting: Internal Medicine

## 2012-03-14 ENCOUNTER — Other Ambulatory Visit: Payer: Self-pay | Admitting: Internal Medicine

## 2012-03-14 DIAGNOSIS — Z1231 Encounter for screening mammogram for malignant neoplasm of breast: Secondary | ICD-10-CM

## 2012-04-02 DIAGNOSIS — M722 Plantar fascial fibromatosis: Secondary | ICD-10-CM | POA: Diagnosis not present

## 2012-04-02 DIAGNOSIS — M79609 Pain in unspecified limb: Secondary | ICD-10-CM | POA: Diagnosis not present

## 2012-05-03 DIAGNOSIS — H409 Unspecified glaucoma: Secondary | ICD-10-CM | POA: Diagnosis not present

## 2012-05-03 DIAGNOSIS — H4011X0 Primary open-angle glaucoma, stage unspecified: Secondary | ICD-10-CM | POA: Diagnosis not present

## 2012-05-03 DIAGNOSIS — H5710 Ocular pain, unspecified eye: Secondary | ICD-10-CM | POA: Diagnosis not present

## 2012-06-26 DIAGNOSIS — I10 Essential (primary) hypertension: Secondary | ICD-10-CM | POA: Diagnosis not present

## 2012-06-26 DIAGNOSIS — E039 Hypothyroidism, unspecified: Secondary | ICD-10-CM | POA: Diagnosis not present

## 2012-06-26 DIAGNOSIS — D649 Anemia, unspecified: Secondary | ICD-10-CM | POA: Diagnosis not present

## 2012-06-26 DIAGNOSIS — R195 Other fecal abnormalities: Secondary | ICD-10-CM | POA: Diagnosis not present

## 2012-06-26 DIAGNOSIS — E7800 Pure hypercholesterolemia, unspecified: Secondary | ICD-10-CM | POA: Diagnosis not present

## 2012-06-27 DIAGNOSIS — Z800 Family history of malignant neoplasm of digestive organs: Secondary | ICD-10-CM | POA: Diagnosis not present

## 2012-06-27 DIAGNOSIS — R198 Other specified symptoms and signs involving the digestive system and abdomen: Secondary | ICD-10-CM | POA: Diagnosis not present

## 2012-06-27 DIAGNOSIS — K219 Gastro-esophageal reflux disease without esophagitis: Secondary | ICD-10-CM | POA: Diagnosis not present

## 2012-06-27 DIAGNOSIS — K5900 Constipation, unspecified: Secondary | ICD-10-CM | POA: Diagnosis not present

## 2012-06-27 DIAGNOSIS — Z1211 Encounter for screening for malignant neoplasm of colon: Secondary | ICD-10-CM | POA: Diagnosis not present

## 2012-07-03 DIAGNOSIS — R109 Unspecified abdominal pain: Secondary | ICD-10-CM | POA: Diagnosis not present

## 2012-07-03 DIAGNOSIS — E039 Hypothyroidism, unspecified: Secondary | ICD-10-CM | POA: Diagnosis not present

## 2012-07-03 DIAGNOSIS — Z0000 Encounter for general adult medical examination without abnormal findings: Secondary | ICD-10-CM | POA: Diagnosis not present

## 2012-07-24 DIAGNOSIS — R7309 Other abnormal glucose: Secondary | ICD-10-CM | POA: Diagnosis not present

## 2012-07-24 DIAGNOSIS — E039 Hypothyroidism, unspecified: Secondary | ICD-10-CM | POA: Diagnosis not present

## 2012-07-24 DIAGNOSIS — I10 Essential (primary) hypertension: Secondary | ICD-10-CM | POA: Diagnosis not present

## 2012-07-30 DIAGNOSIS — E559 Vitamin D deficiency, unspecified: Secondary | ICD-10-CM | POA: Diagnosis not present

## 2012-07-30 DIAGNOSIS — I10 Essential (primary) hypertension: Secondary | ICD-10-CM | POA: Diagnosis not present

## 2012-07-30 DIAGNOSIS — Z23 Encounter for immunization: Secondary | ICD-10-CM | POA: Diagnosis not present

## 2012-07-30 DIAGNOSIS — E039 Hypothyroidism, unspecified: Secondary | ICD-10-CM | POA: Diagnosis not present

## 2012-07-30 DIAGNOSIS — R7309 Other abnormal glucose: Secondary | ICD-10-CM | POA: Diagnosis not present

## 2012-08-07 DIAGNOSIS — R05 Cough: Secondary | ICD-10-CM | POA: Diagnosis not present

## 2012-08-28 DIAGNOSIS — E559 Vitamin D deficiency, unspecified: Secondary | ICD-10-CM | POA: Diagnosis not present

## 2012-08-28 DIAGNOSIS — I10 Essential (primary) hypertension: Secondary | ICD-10-CM | POA: Diagnosis not present

## 2012-08-28 DIAGNOSIS — E039 Hypothyroidism, unspecified: Secondary | ICD-10-CM | POA: Diagnosis not present

## 2012-09-04 DIAGNOSIS — I10 Essential (primary) hypertension: Secondary | ICD-10-CM | POA: Diagnosis not present

## 2012-09-23 DIAGNOSIS — D235 Other benign neoplasm of skin of trunk: Secondary | ICD-10-CM | POA: Diagnosis not present

## 2012-09-23 DIAGNOSIS — L723 Sebaceous cyst: Secondary | ICD-10-CM | POA: Diagnosis not present

## 2012-09-23 DIAGNOSIS — L608 Other nail disorders: Secondary | ICD-10-CM | POA: Diagnosis not present

## 2012-09-23 DIAGNOSIS — L905 Scar conditions and fibrosis of skin: Secondary | ICD-10-CM | POA: Diagnosis not present

## 2012-10-09 DIAGNOSIS — R198 Other specified symptoms and signs involving the digestive system and abdomen: Secondary | ICD-10-CM | POA: Diagnosis not present

## 2012-10-09 DIAGNOSIS — Z1211 Encounter for screening for malignant neoplasm of colon: Secondary | ICD-10-CM | POA: Diagnosis not present

## 2012-10-09 DIAGNOSIS — Z800 Family history of malignant neoplasm of digestive organs: Secondary | ICD-10-CM | POA: Diagnosis not present

## 2012-10-21 DIAGNOSIS — Z23 Encounter for immunization: Secondary | ICD-10-CM | POA: Diagnosis not present

## 2012-11-01 DIAGNOSIS — H409 Unspecified glaucoma: Secondary | ICD-10-CM | POA: Diagnosis not present

## 2012-11-01 DIAGNOSIS — H4011X0 Primary open-angle glaucoma, stage unspecified: Secondary | ICD-10-CM | POA: Diagnosis not present

## 2013-01-22 DIAGNOSIS — I10 Essential (primary) hypertension: Secondary | ICD-10-CM | POA: Diagnosis not present

## 2013-01-29 DIAGNOSIS — J984 Other disorders of lung: Secondary | ICD-10-CM | POA: Diagnosis not present

## 2013-01-29 DIAGNOSIS — R7309 Other abnormal glucose: Secondary | ICD-10-CM | POA: Diagnosis not present

## 2013-01-29 DIAGNOSIS — R05 Cough: Secondary | ICD-10-CM | POA: Diagnosis not present

## 2013-02-18 ENCOUNTER — Other Ambulatory Visit: Payer: Self-pay

## 2013-02-18 DIAGNOSIS — Z1231 Encounter for screening mammogram for malignant neoplasm of breast: Secondary | ICD-10-CM

## 2013-03-05 ENCOUNTER — Other Ambulatory Visit: Payer: Self-pay | Admitting: Internal Medicine

## 2013-03-05 DIAGNOSIS — R634 Abnormal weight loss: Secondary | ICD-10-CM

## 2013-03-05 DIAGNOSIS — R490 Dysphonia: Secondary | ICD-10-CM | POA: Diagnosis not present

## 2013-03-05 DIAGNOSIS — E039 Hypothyroidism, unspecified: Secondary | ICD-10-CM | POA: Diagnosis not present

## 2013-03-05 DIAGNOSIS — I10 Essential (primary) hypertension: Secondary | ICD-10-CM | POA: Diagnosis not present

## 2013-03-11 ENCOUNTER — Other Ambulatory Visit: Payer: Medicare Other

## 2013-03-12 ENCOUNTER — Ambulatory Visit
Admission: RE | Admit: 2013-03-12 | Discharge: 2013-03-12 | Disposition: A | Discharge status: Home / Self Care | Payer: Medicare Other | Source: Ambulatory Visit | Attending: Internal Medicine | Admitting: Internal Medicine

## 2013-03-12 ENCOUNTER — Other Ambulatory Visit: Payer: Medicare Other

## 2013-03-12 DIAGNOSIS — R634 Abnormal weight loss: Secondary | ICD-10-CM | POA: Diagnosis not present

## 2013-03-12 DIAGNOSIS — K7689 Other specified diseases of liver: Secondary | ICD-10-CM | POA: Diagnosis not present

## 2013-03-12 MED ORDER — IOHEXOL 300 MG/ML  SOLN
100.0000 mL | Freq: Once | INTRAMUSCULAR | Status: AC | PRN
  Administered 2013-03-12: 100 mL via INTRAVENOUS

## 2013-03-13 DIAGNOSIS — R0982 Postnasal drip: Secondary | ICD-10-CM | POA: Diagnosis not present

## 2013-03-13 DIAGNOSIS — R490 Dysphonia: Secondary | ICD-10-CM | POA: Diagnosis not present

## 2013-03-13 DIAGNOSIS — J310 Chronic rhinitis: Secondary | ICD-10-CM | POA: Diagnosis not present

## 2013-03-13 DIAGNOSIS — J343 Hypertrophy of nasal turbinates: Secondary | ICD-10-CM | POA: Diagnosis not present

## 2013-03-18 ENCOUNTER — Ambulatory Visit: Payer: Medicare Other

## 2013-03-28 DIAGNOSIS — B351 Tinea unguium: Secondary | ICD-10-CM | POA: Diagnosis not present

## 2013-03-28 DIAGNOSIS — S8420XA Injury of cutaneous sensory nerve at lower leg level, unspecified leg, initial encounter: Secondary | ICD-10-CM | POA: Diagnosis not present

## 2013-03-28 DIAGNOSIS — L608 Other nail disorders: Secondary | ICD-10-CM | POA: Diagnosis not present

## 2013-03-28 DIAGNOSIS — M2140 Flat foot [pes planus] (acquired), unspecified foot: Secondary | ICD-10-CM | POA: Diagnosis not present

## 2013-03-28 DIAGNOSIS — M898X9 Other specified disorders of bone, unspecified site: Secondary | ICD-10-CM | POA: Diagnosis not present

## 2013-04-04 ENCOUNTER — Ambulatory Visit
Admission: RE | Admit: 2013-04-04 | Discharge: 2013-04-04 | Disposition: A | Discharge status: Home / Self Care | Payer: Medicare Other | Source: Ambulatory Visit

## 2013-04-04 DIAGNOSIS — Z1231 Encounter for screening mammogram for malignant neoplasm of breast: Secondary | ICD-10-CM

## 2013-04-08 ENCOUNTER — Other Ambulatory Visit: Payer: Self-pay | Admitting: Internal Medicine

## 2013-04-08 DIAGNOSIS — R928 Other abnormal and inconclusive findings on diagnostic imaging of breast: Secondary | ICD-10-CM

## 2013-04-18 DIAGNOSIS — L608 Other nail disorders: Secondary | ICD-10-CM | POA: Diagnosis not present

## 2013-04-21 ENCOUNTER — Ambulatory Visit
Admission: RE | Admit: 2013-04-21 | Discharge: 2013-04-21 | Disposition: A | Discharge status: Home / Self Care | Payer: Medicare Other | Source: Ambulatory Visit | Attending: Internal Medicine | Admitting: Internal Medicine

## 2013-04-21 DIAGNOSIS — R928 Other abnormal and inconclusive findings on diagnostic imaging of breast: Secondary | ICD-10-CM

## 2013-04-21 DIAGNOSIS — N6049 Mammary duct ectasia of unspecified breast: Secondary | ICD-10-CM | POA: Diagnosis not present

## 2013-05-05 DIAGNOSIS — I10 Essential (primary) hypertension: Secondary | ICD-10-CM | POA: Diagnosis not present

## 2013-05-05 DIAGNOSIS — B029 Zoster without complications: Secondary | ICD-10-CM | POA: Diagnosis not present

## 2013-05-05 DIAGNOSIS — E039 Hypothyroidism, unspecified: Secondary | ICD-10-CM | POA: Diagnosis not present

## 2013-05-05 DIAGNOSIS — R7309 Other abnormal glucose: Secondary | ICD-10-CM | POA: Diagnosis not present

## 2013-05-05 DIAGNOSIS — E559 Vitamin D deficiency, unspecified: Secondary | ICD-10-CM | POA: Diagnosis not present

## 2013-05-08 DIAGNOSIS — R7309 Other abnormal glucose: Secondary | ICD-10-CM | POA: Diagnosis not present

## 2013-05-08 DIAGNOSIS — E559 Vitamin D deficiency, unspecified: Secondary | ICD-10-CM | POA: Diagnosis not present

## 2013-05-08 DIAGNOSIS — I10 Essential (primary) hypertension: Secondary | ICD-10-CM | POA: Diagnosis not present

## 2013-05-08 DIAGNOSIS — E039 Hypothyroidism, unspecified: Secondary | ICD-10-CM | POA: Diagnosis not present

## 2013-08-01 DIAGNOSIS — I10 Essential (primary) hypertension: Secondary | ICD-10-CM | POA: Diagnosis not present

## 2013-08-01 DIAGNOSIS — E039 Hypothyroidism, unspecified: Secondary | ICD-10-CM | POA: Diagnosis not present

## 2013-08-01 DIAGNOSIS — E559 Vitamin D deficiency, unspecified: Secondary | ICD-10-CM | POA: Diagnosis not present

## 2013-08-01 DIAGNOSIS — R7309 Other abnormal glucose: Secondary | ICD-10-CM | POA: Diagnosis not present

## 2013-08-18 DIAGNOSIS — Z23 Encounter for immunization: Secondary | ICD-10-CM | POA: Diagnosis not present

## 2013-11-04 DIAGNOSIS — R7309 Other abnormal glucose: Secondary | ICD-10-CM | POA: Diagnosis not present

## 2013-11-04 DIAGNOSIS — I10 Essential (primary) hypertension: Secondary | ICD-10-CM | POA: Diagnosis not present

## 2013-11-04 DIAGNOSIS — E559 Vitamin D deficiency, unspecified: Secondary | ICD-10-CM | POA: Diagnosis not present

## 2013-11-04 DIAGNOSIS — E039 Hypothyroidism, unspecified: Secondary | ICD-10-CM | POA: Diagnosis not present

## 2013-11-06 DIAGNOSIS — H409 Unspecified glaucoma: Secondary | ICD-10-CM | POA: Diagnosis not present

## 2013-11-06 DIAGNOSIS — H4011X0 Primary open-angle glaucoma, stage unspecified: Secondary | ICD-10-CM | POA: Diagnosis not present

## 2013-11-11 DIAGNOSIS — R7309 Other abnormal glucose: Secondary | ICD-10-CM | POA: Diagnosis not present

## 2013-11-11 DIAGNOSIS — I10 Essential (primary) hypertension: Secondary | ICD-10-CM | POA: Diagnosis not present

## 2013-11-11 DIAGNOSIS — E039 Hypothyroidism, unspecified: Secondary | ICD-10-CM | POA: Diagnosis not present

## 2013-11-11 DIAGNOSIS — E559 Vitamin D deficiency, unspecified: Secondary | ICD-10-CM | POA: Diagnosis not present

## 2014-02-05 DIAGNOSIS — M5430 Sciatica, unspecified side: Secondary | ICD-10-CM | POA: Diagnosis not present

## 2014-03-13 DIAGNOSIS — R109 Unspecified abdominal pain: Secondary | ICD-10-CM | POA: Diagnosis not present

## 2014-03-18 ENCOUNTER — Other Ambulatory Visit: Payer: Self-pay

## 2014-03-18 DIAGNOSIS — Z9889 Other specified postprocedural states: Secondary | ICD-10-CM

## 2014-03-18 DIAGNOSIS — Z1231 Encounter for screening mammogram for malignant neoplasm of breast: Secondary | ICD-10-CM

## 2014-03-31 DIAGNOSIS — R198 Other specified symptoms and signs involving the digestive system and abdomen: Secondary | ICD-10-CM | POA: Diagnosis not present

## 2014-03-31 DIAGNOSIS — K625 Hemorrhage of anus and rectum: Secondary | ICD-10-CM | POA: Diagnosis not present

## 2014-03-31 DIAGNOSIS — K5730 Diverticulosis of large intestine without perforation or abscess without bleeding: Secondary | ICD-10-CM | POA: Diagnosis not present

## 2014-03-31 DIAGNOSIS — R1013 Epigastric pain: Secondary | ICD-10-CM | POA: Diagnosis not present

## 2014-04-06 ENCOUNTER — Ambulatory Visit
Admission: RE | Admit: 2014-04-06 | Discharge: 2014-04-06 | Disposition: A | Discharge status: Home / Self Care | Payer: Medicare Other | Source: Ambulatory Visit

## 2014-04-06 ENCOUNTER — Encounter (INDEPENDENT_AMBULATORY_CARE_PROVIDER_SITE_OTHER): Payer: Self-pay

## 2014-04-06 DIAGNOSIS — Z1231 Encounter for screening mammogram for malignant neoplasm of breast: Secondary | ICD-10-CM

## 2014-04-06 DIAGNOSIS — Z9889 Other specified postprocedural states: Secondary | ICD-10-CM

## 2014-04-16 DIAGNOSIS — R141 Gas pain: Secondary | ICD-10-CM | POA: Diagnosis not present

## 2014-04-16 DIAGNOSIS — R143 Flatulence: Secondary | ICD-10-CM | POA: Diagnosis not present

## 2014-04-16 DIAGNOSIS — K5730 Diverticulosis of large intestine without perforation or abscess without bleeding: Secondary | ICD-10-CM | POA: Diagnosis not present

## 2014-05-05 DIAGNOSIS — H409 Unspecified glaucoma: Secondary | ICD-10-CM | POA: Diagnosis not present

## 2014-05-05 DIAGNOSIS — Z961 Presence of intraocular lens: Secondary | ICD-10-CM | POA: Diagnosis not present

## 2014-05-05 DIAGNOSIS — H4011X0 Primary open-angle glaucoma, stage unspecified: Secondary | ICD-10-CM | POA: Diagnosis not present

## 2014-05-12 DIAGNOSIS — I10 Essential (primary) hypertension: Secondary | ICD-10-CM | POA: Diagnosis not present

## 2014-05-12 DIAGNOSIS — E039 Hypothyroidism, unspecified: Secondary | ICD-10-CM | POA: Diagnosis not present

## 2014-05-12 DIAGNOSIS — E559 Vitamin D deficiency, unspecified: Secondary | ICD-10-CM | POA: Diagnosis not present

## 2014-05-12 DIAGNOSIS — R7309 Other abnormal glucose: Secondary | ICD-10-CM | POA: Diagnosis not present

## 2014-05-14 DIAGNOSIS — R7309 Other abnormal glucose: Secondary | ICD-10-CM | POA: Diagnosis not present

## 2014-05-14 DIAGNOSIS — E039 Hypothyroidism, unspecified: Secondary | ICD-10-CM | POA: Diagnosis not present

## 2014-05-14 DIAGNOSIS — E559 Vitamin D deficiency, unspecified: Secondary | ICD-10-CM | POA: Diagnosis not present

## 2014-05-14 DIAGNOSIS — I10 Essential (primary) hypertension: Secondary | ICD-10-CM | POA: Diagnosis not present

## 2014-05-21 DIAGNOSIS — M19079 Primary osteoarthritis, unspecified ankle and foot: Secondary | ICD-10-CM | POA: Diagnosis not present

## 2014-05-21 DIAGNOSIS — M25569 Pain in unspecified knee: Secondary | ICD-10-CM | POA: Diagnosis not present

## 2014-05-21 DIAGNOSIS — M7120 Synovial cyst of popliteal space [Baker], unspecified knee: Secondary | ICD-10-CM | POA: Diagnosis not present

## 2014-05-21 DIAGNOSIS — M79609 Pain in unspecified limb: Secondary | ICD-10-CM | POA: Diagnosis not present

## 2014-07-10 ENCOUNTER — Emergency Department (HOSPITAL_COMMUNITY)
Admission: EM | Admit: 2014-07-10 | Discharge: 2014-07-10 | Disposition: A | Discharge status: Home / Self Care | Payer: Medicare Other | Attending: Emergency Medicine | Admitting: Emergency Medicine

## 2014-07-10 ENCOUNTER — Encounter (HOSPITAL_COMMUNITY): Payer: Self-pay | Admitting: Emergency Medicine

## 2014-07-10 ENCOUNTER — Emergency Department (HOSPITAL_COMMUNITY): Payer: Medicare Other

## 2014-07-10 DIAGNOSIS — I10 Essential (primary) hypertension: Secondary | ICD-10-CM | POA: Insufficient documentation

## 2014-07-10 DIAGNOSIS — Z8669 Personal history of other diseases of the nervous system and sense organs: Secondary | ICD-10-CM | POA: Diagnosis not present

## 2014-07-10 DIAGNOSIS — R109 Unspecified abdominal pain: Secondary | ICD-10-CM | POA: Diagnosis not present

## 2014-07-10 DIAGNOSIS — R42 Dizziness and giddiness: Secondary | ICD-10-CM | POA: Diagnosis not present

## 2014-07-10 DIAGNOSIS — R1030 Lower abdominal pain, unspecified: Secondary | ICD-10-CM

## 2014-07-10 DIAGNOSIS — Z880 Allergy status to penicillin: Secondary | ICD-10-CM | POA: Insufficient documentation

## 2014-07-10 DIAGNOSIS — Z79899 Other long term (current) drug therapy: Secondary | ICD-10-CM | POA: Diagnosis not present

## 2014-07-10 DIAGNOSIS — M25519 Pain in unspecified shoulder: Secondary | ICD-10-CM | POA: Diagnosis not present

## 2014-07-10 HISTORY — DX: Nonrheumatic mitral (valve) prolapse: I34.1

## 2014-07-10 HISTORY — DX: Essential (primary) hypertension: I10

## 2014-07-10 HISTORY — DX: Unspecified glaucoma: H40.9

## 2014-07-10 LAB — BASIC METABOLIC PANEL
Anion gap: 11 (ref 5–15)
BUN: 12 mg/dL (ref 6–23)
CHLORIDE: 100 meq/L (ref 96–112)
CO2: 21 meq/L (ref 19–32)
Calcium: 8.2 mg/dL — ABNORMAL LOW (ref 8.4–10.5)
Creatinine, Ser: 0.7 mg/dL (ref 0.50–1.10)
GFR calc non Af Amer: 79 mL/min — ABNORMAL LOW (ref >90)
Glucose, Bld: 103 mg/dL — ABNORMAL HIGH (ref 70–99)
POTASSIUM: 4.9 meq/L (ref 3.7–5.3)
Sodium: 132 mEq/L — ABNORMAL LOW (ref 137–147)

## 2014-07-10 LAB — CBC
HCT: 32.7 % — ABNORMAL LOW (ref 36.0–46.0)
Hemoglobin: 11.3 g/dL — ABNORMAL LOW (ref 12.0–15.0)
MCH: 31.7 pg (ref 26.0–34.0)
MCHC: 34.6 g/dL (ref 30.0–36.0)
MCV: 91.9 fL (ref 78.0–100.0)
Platelets: 189 10*3/uL (ref 150–400)
RBC: 3.56 MIL/uL — AB (ref 3.87–5.11)
RDW: 12.3 % (ref 11.5–15.5)
WBC: 6.5 10*3/uL (ref 4.0–10.5)

## 2014-07-10 LAB — LIPASE, BLOOD: Lipase: 23 U/L (ref 11–59)

## 2014-07-10 LAB — HEPATIC FUNCTION PANEL
ALBUMIN: 3.3 g/dL — AB (ref 3.5–5.2)
ALT: 16 U/L (ref 0–35)
AST: 22 U/L (ref 0–37)
Alkaline Phosphatase: 56 U/L (ref 39–117)
Bilirubin, Direct: <0.2 mg/dL (ref 0.0–0.3)
TOTAL PROTEIN: 6 g/dL (ref 6.0–8.3)
Total Bilirubin: 0.5 mg/dL (ref 0.3–1.2)

## 2014-07-10 LAB — URINALYSIS, ROUTINE W REFLEX MICROSCOPIC
BILIRUBIN URINE: NEGATIVE
Glucose, UA: NEGATIVE mg/dL
Hgb urine dipstick: NEGATIVE
Ketones, ur: NEGATIVE mg/dL
LEUKOCYTES UA: NEGATIVE
Nitrite: NEGATIVE
PH: 7 (ref 5.0–8.0)
Protein, ur: NEGATIVE mg/dL
SPECIFIC GRAVITY, URINE: 1.008 (ref 1.005–1.030)
Urobilinogen, UA: 0.2 mg/dL (ref 0.0–1.0)

## 2014-07-10 LAB — TROPONIN I: Troponin I: <0.3 ng/mL (ref <0.30)

## 2014-07-10 LAB — I-STAT CG4 LACTIC ACID, ED: Lactic Acid, Venous: 0.63 mmol/L (ref 0.5–2.2)

## 2014-07-10 MED ORDER — SODIUM CHLORIDE 0.9 % IV BOLUS (SEPSIS)
500.0000 mL | Freq: Once | INTRAVENOUS | Status: AC
  Administered 2014-07-10: 500 mL via INTRAVENOUS

## 2014-07-10 NOTE — ED Provider Notes (Signed)
CSN: [id number]     Arrival date & time 07/10/14  1251 History   First MD Initiated Contact with Patient 07/10/14 1252     Chief Complaint  Patient presents with  . Dizziness  . Abdominal Pain     (Consider location/radiation/quality/duration/timing/severity/associated sxs/prior Treatment) HPI  Tammy Hampton is a 80 y.o. female with past medical history significant for hypertension, mitral valve prolapse complaining of acute onset of midline lower abdominal pain rated as severe lasting about 2 hours last night now largely resolved. Patient states that she has a very mild sedative 10 lingering abdominal discomfort that she woke this morning feeling extremely lightheaded, she denies chest pain, shortness of breath, fever, chills, dysuria, hematuria, urinary frequency that is atypical for her (she is on diuretics). She had 2 normal bowel movements this morning, there was no blood or melena. States that she is now having pain in the bilateral shoulders  Past Medical History  Diagnosis Date  . Hypertension   . Mitral valve prolapse   . Glaucoma    Past Surgical History  Procedure Laterality Date  . Appendectomy    . Abdominal hysterectomy    . Thyroidectomy  1967  . Cholecystectomy     No family history on file. History  Substance Use Topics  . Smoking status: Never Smoker   . Smokeless tobacco: Not on file  . Alcohol Use: No   OB History   Grav Para Term Preterm Abortions TAB SAB Ect Mult Living                 Review of Systems  10 systems reviewed and found to be negative, except as noted in the HPI.   Allergies  Macrobid; Macrolides and ketolides; Penicillins; Sulfa antibiotics; and Zithromax  Home Medications   Prior to Admission medications   Medication Sig Start Date End Date Taking? Authorizing Provider  co-enzyme Q-10 50 MG capsule Take 100 mg by mouth daily.   Yes Historical Provider, MD  dorzolamide-timolol (COSOPT) 22.3-6.8 MG/ML ophthalmic solution  Place 1 drop into both eyes 2 (two) times daily.   Yes Historical Provider, MD  latanoprost (XALATAN) 0.005 % ophthalmic solution Place 1 drop into both eyes at bedtime.   Yes Historical Provider, MD  levothyroxine (SYNTHROID, LEVOTHROID) 100 MCG tablet Take 100 mcg by mouth daily before breakfast.   Yes Historical Provider, MD  losartan (COZAAR) 50 MG tablet Take 50 mg by mouth daily.   Yes Historical Provider, MD  lovastatin (MEVACOR) 20 MG tablet Take 20 mg by mouth at bedtime.   Yes Historical Provider, MD  Omega-3 Fatty Acids (FISH OIL) 1000 MG CAPS Take 1 capsule by mouth daily.   Yes Historical Provider, MD  Probiotic Product (PROBIOTIC DAILY PO) Take 1 capsule by mouth at bedtime.   Yes Historical Provider, MD   BP 139/51  Pulse 58  Temp(Src) 97.7 F (36.5 C) (Oral)  Resp 14  SpO2 98% Physical Exam  Constitutional: She is oriented to person, place, and time.  Well appearing, hoarse voice  HENT:  Head: Normocephalic and atraumatic.  Mouth/Throat: Oropharynx is clear and moist.  Eyes: Conjunctivae are normal. Pupils are equal, round, and reactive to light.  Neck: Normal range of motion. Neck supple. No JVD present.  Cardiovascular: Normal rate, regular rhythm and intact distal pulses.   Pulmonary/Chest: Effort normal and breath sounds normal. No respiratory distress. She has no wheezes. She has no rales. She exhibits no tenderness.  Abdominal: Soft. Bowel sounds are normal.  She exhibits no distension and no mass. There is no tenderness. There is no rebound and no guarding.  Musculoskeletal: Normal range of motion. She exhibits no edema and no tenderness.  Neurological: She is alert and oriented to person, place, and time.  Follows commands, Clear, goal oriented speech, Strength is 5 out of 5x4 extremities, patient ambulates with a coordinated in nonantalgic gait. Sensation is grossly intact.     ED Course  Procedures (including critical care time) Labs Review Labs Reviewed   CBC - Abnormal; Notable for the following:    RBC 3.56 (*)    Hemoglobin 11.3 (*)    HCT 32.7 (*)    All other components within normal limits  BASIC METABOLIC PANEL - Abnormal; Notable for the following:    Sodium 132 (*)    Glucose, Bld 103 (*)    Calcium 8.2 (*)    GFR calc non Af Amer 79 (*)    All other components within normal limits  HEPATIC FUNCTION PANEL - Abnormal; Notable for the following:    Albumin 3.3 (*)    All other components within normal limits  LIPASE, BLOOD  TROPONIN I  URINALYSIS, ROUTINE W REFLEX MICROSCOPIC  TROPONIN I  I-STAT CG4 LACTIC ACID, ED    Imaging Review Dg Chest 2 View  07/10/2014   CLINICAL DATA:  Dizziness and generalized PA and lateral chest x-ray of April 27, 2010 lower abdominal pain as well as sinus drainage. History of thyroid malignancy  EXAM: CHEST  2 VIEW  COMPARISON:  PA and lateral chest x-ray of April 27, 2010.  FINDINGS: The lungs are mildly hyperinflated and clear. The heart and pulmonary vascularity are normal. There is no pleural effusion. The mediastinum is normal in width. There is mild stable tortuosity of the descending thoracic aorta. The bony thorax is unremarkable. The gas pattern within the upper abdomen is normal.  IMPRESSION: There is mild hyperinflation which may be voluntary or could reflect underlying reactive airway disease or COPD. There is no evidence of pneumonia nor CHF nor other acute cardiopulmonary abnormality.   Electronically Signed   By: David  Martinique   On: 07/10/2014 14:05     EKG Interpretation   Date/Time:  Friday July 10 2014 13:04:44 EDT Ventricular Rate:  70 PR Interval:  61 QRS Duration: 138 QT Interval:  448 QTC Calculation: 483 R Axis:   -30 Text Interpretation:  Wandering atrial pacemaker Left bundle branch block  No significant change since last tracing Confirmed by WARD,  DO, KRISTEN  [phone]) on 07/10/2014 1:08:36 PM      MDM   Final diagnoses:  None    Filed Vitals:   07/10/14  1415 07/10/14 1430 07/10/14 1445 07/10/14 1500  BP: 123/51 139/53 140/63 139/51  Pulse: 54 55 55 58  Temp:      TempSrc:      Resp:      SpO2: 97% 99% 98% 98%    Medications  sodium chloride 0.9 % bolus 500 mL (0 mLs Intravenous Stopped 07/10/14 1409)    Tammy Hampton is a 80 y.o. female presenting with acute severe lower abdominal pain onset last night this is now resolved. Patient had dizziness and bilateral shoulder pain this morning. Serial abdominal exams are benign. EKG with no ischemic change. Urinalysis, blood work and chest x-ray unremarkable.   This is a shared visit with the attending physician who personally evaluated the patient and agrees with the care plan.   Case signed out to  Dr. Vanita Panda at shift change: Plan is to followup delta troponin and discharged home if negative.  Monico Blitz, PA-C 07/10/14 1559  Monico Blitz, PA-C 07/10/14 1601

## 2014-07-10 NOTE — ED Notes (Signed)
Per EMS: Pt from home; reports last night she started having central abdominal pain and bilateral shoulders pain. States she went to bed but awoke this AM feeling dizzy and lightheaded. Denies any syncopal episodes. On EMS arrival, pt reports that the dizziness resolved but reports 1/10 "tension" HA and 1/10 abdominal pain. Pt AO x4, neuro intact. 140/68. 54 bpm. Pt ambulatory,.

## 2014-07-10 NOTE — ED Notes (Signed)
Lab results given to Dr.Ward. 

## 2014-07-10 NOTE — Discharge Instructions (Signed)
Please follow with your primary care doctor in the next 2 days for a check-up. They must obtain records for further management.  ° °Do not hesitate to return to the Emergency Department for any new, worsening or concerning symptoms.  ° °

## 2014-07-10 NOTE — ED Provider Notes (Signed)
Medical screening examination/treatment/procedure(s) were conducted as a shared visit with non-physician practitioner(s) and myself.  I personally evaluated the patient during the encounter.   EKG Interpretation   Date/Time:  Friday July 10 2014 13:04:44 EDT Ventricular Rate:  70 PR Interval:  61 QRS Duration: 138 QT Interval:  448 QTC Calculation: 483 R Axis:   -30 Text Interpretation:  Wandering atrial pacemaker Left bundle branch block  No significant change since last tracing Confirmed by WARD,  DO, KRISTEN  ([id number]) on 07/10/2014 1:08:36 PM      Pt is a 80 y.o. F with history of hypertension who presents to the emergency department with lower abdominal cramping that woke her from sleep last night. She did have an episode earlier today where she felt lightheaded but this is resolved. She states this morning she also did have some achy feelings in her bilateral shoulders, worse on the right compared to left but this is also gone. No chest pain or shortness of breath. No vomiting. No abdominal pain currently. She states she feels great and has no current complaints. On exam, patient is hemodynamically stable, neurologically intact, normal heart lung sounds are equal pulses in all extremities. She is pleasant, nontoxic and in no distress. She has no current complaints. EKG shows an old left bundle branch block. Labs have been unremarkable including a negative troponin. Chest x-ray clear. Urine shows no sign of infection. Unclear etiology for patient's symptoms but given that all resolved hours prior to arrival in her workup here today is unremarkable, I feel she is safe to be discharged home. Discussed strict return precautions. Patient and family are comfortable with this plan.  [place], DO 07/10/14 1620

## 2014-07-13 DIAGNOSIS — R7309 Other abnormal glucose: Secondary | ICD-10-CM | POA: Diagnosis not present

## 2014-07-13 DIAGNOSIS — R42 Dizziness and giddiness: Secondary | ICD-10-CM | POA: Diagnosis not present

## 2014-07-13 DIAGNOSIS — E039 Hypothyroidism, unspecified: Secondary | ICD-10-CM | POA: Diagnosis not present

## 2014-07-13 DIAGNOSIS — I10 Essential (primary) hypertension: Secondary | ICD-10-CM | POA: Diagnosis not present

## 2014-07-20 ENCOUNTER — Other Ambulatory Visit: Payer: Self-pay | Admitting: Internal Medicine

## 2014-07-20 DIAGNOSIS — R42 Dizziness and giddiness: Secondary | ICD-10-CM

## 2014-07-21 ENCOUNTER — Ambulatory Visit
Admission: RE | Admit: 2014-07-21 | Discharge: 2014-07-21 | Disposition: A | Discharge status: Home / Self Care | Payer: Medicare Other | Source: Ambulatory Visit | Attending: Internal Medicine | Admitting: Internal Medicine

## 2014-07-21 DIAGNOSIS — R42 Dizziness and giddiness: Secondary | ICD-10-CM | POA: Diagnosis not present

## 2014-08-05 DIAGNOSIS — E039 Hypothyroidism, unspecified: Secondary | ICD-10-CM | POA: Diagnosis not present

## 2014-08-05 DIAGNOSIS — M25569 Pain in unspecified knee: Secondary | ICD-10-CM | POA: Diagnosis not present

## 2014-08-05 DIAGNOSIS — I10 Essential (primary) hypertension: Secondary | ICD-10-CM | POA: Diagnosis not present

## 2014-08-05 DIAGNOSIS — R7309 Other abnormal glucose: Secondary | ICD-10-CM | POA: Diagnosis not present

## 2014-08-25 DIAGNOSIS — H4011X3 Primary open-angle glaucoma, severe stage: Secondary | ICD-10-CM | POA: Diagnosis not present

## 2014-08-28 DIAGNOSIS — M19071 Primary osteoarthritis, right ankle and foot: Secondary | ICD-10-CM | POA: Diagnosis not present

## 2014-08-28 DIAGNOSIS — Z23 Encounter for immunization: Secondary | ICD-10-CM | POA: Diagnosis not present

## 2014-10-21 DIAGNOSIS — H4011X3 Primary open-angle glaucoma, severe stage: Secondary | ICD-10-CM | POA: Diagnosis not present

## 2014-10-26 DIAGNOSIS — M19071 Primary osteoarthritis, right ankle and foot: Secondary | ICD-10-CM | POA: Diagnosis not present

## 2014-11-03 DIAGNOSIS — H9311 Tinnitus, right ear: Secondary | ICD-10-CM | POA: Diagnosis not present

## 2014-11-03 DIAGNOSIS — J300 Vasomotor rhinitis: Secondary | ICD-10-CM | POA: Diagnosis not present

## 2014-11-03 DIAGNOSIS — J342 Deviated nasal septum: Secondary | ICD-10-CM | POA: Diagnosis not present

## 2014-11-03 DIAGNOSIS — H9191 Unspecified hearing loss, right ear: Secondary | ICD-10-CM | POA: Diagnosis not present

## 2014-11-04 DIAGNOSIS — R7309 Other abnormal glucose: Secondary | ICD-10-CM | POA: Diagnosis not present

## 2014-11-04 DIAGNOSIS — R634 Abnormal weight loss: Secondary | ICD-10-CM | POA: Diagnosis not present

## 2014-11-04 DIAGNOSIS — E039 Hypothyroidism, unspecified: Secondary | ICD-10-CM | POA: Diagnosis not present

## 2014-11-04 DIAGNOSIS — E559 Vitamin D deficiency, unspecified: Secondary | ICD-10-CM | POA: Diagnosis not present

## 2014-11-04 DIAGNOSIS — E780 Pure hypercholesterolemia: Secondary | ICD-10-CM | POA: Diagnosis not present

## 2014-11-05 DIAGNOSIS — M25562 Pain in left knee: Secondary | ICD-10-CM | POA: Diagnosis not present

## 2014-11-05 DIAGNOSIS — M1712 Unilateral primary osteoarthritis, left knee: Secondary | ICD-10-CM | POA: Diagnosis not present

## 2014-11-23 DIAGNOSIS — E559 Vitamin D deficiency, unspecified: Secondary | ICD-10-CM | POA: Diagnosis not present

## 2014-11-23 DIAGNOSIS — R739 Hyperglycemia, unspecified: Secondary | ICD-10-CM | POA: Diagnosis not present

## 2014-11-23 DIAGNOSIS — I10 Essential (primary) hypertension: Secondary | ICD-10-CM | POA: Diagnosis not present

## 2014-11-23 DIAGNOSIS — E039 Hypothyroidism, unspecified: Secondary | ICD-10-CM | POA: Diagnosis not present

## 2014-12-07 DIAGNOSIS — M779 Enthesopathy, unspecified: Secondary | ICD-10-CM | POA: Diagnosis not present

## 2014-12-07 DIAGNOSIS — M19071 Primary osteoarthritis, right ankle and foot: Secondary | ICD-10-CM | POA: Diagnosis not present

## 2014-12-30 DIAGNOSIS — R194 Change in bowel habit: Secondary | ICD-10-CM | POA: Diagnosis not present

## 2015-01-11 DIAGNOSIS — Z1212 Encounter for screening for malignant neoplasm of rectum: Secondary | ICD-10-CM | POA: Diagnosis not present

## 2015-01-28 DIAGNOSIS — L600 Ingrowing nail: Secondary | ICD-10-CM | POA: Diagnosis not present

## 2015-01-28 DIAGNOSIS — M79675 Pain in left toe(s): Secondary | ICD-10-CM | POA: Diagnosis not present

## 2015-02-02 DIAGNOSIS — M1712 Unilateral primary osteoarthritis, left knee: Secondary | ICD-10-CM | POA: Diagnosis not present

## 2015-02-04 DIAGNOSIS — M7751 Other enthesopathy of right foot: Secondary | ICD-10-CM | POA: Diagnosis not present

## 2015-02-04 DIAGNOSIS — L603 Nail dystrophy: Secondary | ICD-10-CM | POA: Diagnosis not present

## 2015-02-04 DIAGNOSIS — I739 Peripheral vascular disease, unspecified: Secondary | ICD-10-CM | POA: Diagnosis not present

## 2015-02-04 DIAGNOSIS — M79671 Pain in right foot: Secondary | ICD-10-CM | POA: Diagnosis not present

## 2015-02-09 DIAGNOSIS — E780 Pure hypercholesterolemia: Secondary | ICD-10-CM | POA: Diagnosis not present

## 2015-02-09 DIAGNOSIS — J301 Allergic rhinitis due to pollen: Secondary | ICD-10-CM | POA: Diagnosis not present

## 2015-02-09 DIAGNOSIS — L821 Other seborrheic keratosis: Secondary | ICD-10-CM | POA: Diagnosis not present

## 2015-02-09 DIAGNOSIS — I10 Essential (primary) hypertension: Secondary | ICD-10-CM | POA: Diagnosis not present

## 2015-02-16 ENCOUNTER — Other Ambulatory Visit: Payer: Self-pay | Admitting: Dermatology

## 2015-02-16 DIAGNOSIS — D485 Neoplasm of uncertain behavior of skin: Secondary | ICD-10-CM | POA: Diagnosis not present

## 2015-02-16 DIAGNOSIS — D692 Other nonthrombocytopenic purpura: Secondary | ICD-10-CM | POA: Diagnosis not present

## 2015-02-16 DIAGNOSIS — L821 Other seborrheic keratosis: Secondary | ICD-10-CM | POA: Diagnosis not present

## 2015-02-16 DIAGNOSIS — D239 Other benign neoplasm of skin, unspecified: Secondary | ICD-10-CM | POA: Diagnosis not present

## 2015-02-25 ENCOUNTER — Other Ambulatory Visit: Payer: Self-pay

## 2015-02-25 DIAGNOSIS — Z1231 Encounter for screening mammogram for malignant neoplasm of breast: Secondary | ICD-10-CM

## 2015-03-08 DIAGNOSIS — H52203 Unspecified astigmatism, bilateral: Secondary | ICD-10-CM | POA: Diagnosis not present

## 2015-03-08 DIAGNOSIS — Z961 Presence of intraocular lens: Secondary | ICD-10-CM | POA: Diagnosis not present

## 2015-03-08 DIAGNOSIS — H4011X2 Primary open-angle glaucoma, moderate stage: Secondary | ICD-10-CM | POA: Diagnosis not present

## 2015-04-08 ENCOUNTER — Ambulatory Visit: Payer: Medicare Other

## 2015-04-08 ENCOUNTER — Ambulatory Visit
Admission: RE | Admit: 2015-04-08 | Discharge: 2015-04-08 | Disposition: A | Discharge status: Home / Self Care | Payer: Medicare Other | Source: Ambulatory Visit

## 2015-04-08 DIAGNOSIS — Z1231 Encounter for screening mammogram for malignant neoplasm of breast: Secondary | ICD-10-CM | POA: Diagnosis not present

## 2015-05-25 DIAGNOSIS — I10 Essential (primary) hypertension: Secondary | ICD-10-CM | POA: Diagnosis not present

## 2015-05-25 DIAGNOSIS — E559 Vitamin D deficiency, unspecified: Secondary | ICD-10-CM | POA: Diagnosis not present

## 2015-05-25 DIAGNOSIS — E039 Hypothyroidism, unspecified: Secondary | ICD-10-CM | POA: Diagnosis not present

## 2015-05-28 DIAGNOSIS — E559 Vitamin D deficiency, unspecified: Secondary | ICD-10-CM | POA: Diagnosis not present

## 2015-05-28 DIAGNOSIS — N390 Urinary tract infection, site not specified: Secondary | ICD-10-CM | POA: Diagnosis not present

## 2015-05-28 DIAGNOSIS — E039 Hypothyroidism, unspecified: Secondary | ICD-10-CM | POA: Diagnosis not present

## 2015-05-28 DIAGNOSIS — I10 Essential (primary) hypertension: Secondary | ICD-10-CM | POA: Diagnosis not present

## 2015-05-28 DIAGNOSIS — L720 Epidermal cyst: Secondary | ICD-10-CM | POA: Diagnosis not present

## 2015-05-28 DIAGNOSIS — E780 Pure hypercholesterolemia: Secondary | ICD-10-CM | POA: Diagnosis not present

## 2015-06-04 DIAGNOSIS — Z1212 Encounter for screening for malignant neoplasm of rectum: Secondary | ICD-10-CM | POA: Diagnosis not present

## 2015-06-14 DIAGNOSIS — L723 Sebaceous cyst: Secondary | ICD-10-CM | POA: Diagnosis not present

## 2015-07-06 DIAGNOSIS — R350 Frequency of micturition: Secondary | ICD-10-CM | POA: Diagnosis not present

## 2015-07-06 DIAGNOSIS — E039 Hypothyroidism, unspecified: Secondary | ICD-10-CM | POA: Diagnosis not present

## 2015-07-06 DIAGNOSIS — I10 Essential (primary) hypertension: Secondary | ICD-10-CM | POA: Diagnosis not present

## 2015-07-07 DIAGNOSIS — N390 Urinary tract infection, site not specified: Secondary | ICD-10-CM | POA: Diagnosis not present

## 2015-07-07 DIAGNOSIS — R350 Frequency of micturition: Secondary | ICD-10-CM | POA: Diagnosis not present

## 2015-07-22 DIAGNOSIS — I739 Peripheral vascular disease, unspecified: Secondary | ICD-10-CM | POA: Diagnosis not present

## 2015-07-22 DIAGNOSIS — L603 Nail dystrophy: Secondary | ICD-10-CM | POA: Diagnosis not present

## 2015-08-23 DIAGNOSIS — H401133 Primary open-angle glaucoma, bilateral, severe stage: Secondary | ICD-10-CM | POA: Diagnosis not present

## 2015-08-27 DIAGNOSIS — I10 Essential (primary) hypertension: Secondary | ICD-10-CM | POA: Diagnosis not present

## 2015-08-27 DIAGNOSIS — E039 Hypothyroidism, unspecified: Secondary | ICD-10-CM | POA: Diagnosis not present

## 2015-08-27 DIAGNOSIS — E559 Vitamin D deficiency, unspecified: Secondary | ICD-10-CM | POA: Diagnosis not present

## 2015-09-01 DIAGNOSIS — I10 Essential (primary) hypertension: Secondary | ICD-10-CM | POA: Diagnosis not present

## 2015-09-01 DIAGNOSIS — Z23 Encounter for immunization: Secondary | ICD-10-CM | POA: Diagnosis not present

## 2015-09-01 DIAGNOSIS — E039 Hypothyroidism, unspecified: Secondary | ICD-10-CM | POA: Diagnosis not present

## 2015-09-01 DIAGNOSIS — R739 Hyperglycemia, unspecified: Secondary | ICD-10-CM | POA: Diagnosis not present

## 2015-09-17 DIAGNOSIS — N390 Urinary tract infection, site not specified: Secondary | ICD-10-CM | POA: Diagnosis not present

## 2015-09-17 DIAGNOSIS — R350 Frequency of micturition: Secondary | ICD-10-CM | POA: Diagnosis not present

## 2015-09-17 DIAGNOSIS — E039 Hypothyroidism, unspecified: Secondary | ICD-10-CM | POA: Diagnosis not present

## 2015-09-30 DIAGNOSIS — I739 Peripheral vascular disease, unspecified: Secondary | ICD-10-CM | POA: Diagnosis not present

## 2015-09-30 DIAGNOSIS — L84 Corns and callosities: Secondary | ICD-10-CM | POA: Diagnosis not present

## 2015-09-30 DIAGNOSIS — L603 Nail dystrophy: Secondary | ICD-10-CM | POA: Diagnosis not present

## 2016-03-08 ENCOUNTER — Other Ambulatory Visit: Payer: Self-pay

## 2016-03-08 DIAGNOSIS — Z1231 Encounter for screening mammogram for malignant neoplasm of breast: Secondary | ICD-10-CM

## 2016-04-10 ENCOUNTER — Ambulatory Visit
Admission: RE | Admit: 2016-04-10 | Discharge: 2016-04-10 | Disposition: A | Discharge status: Home / Self Care | Payer: Medicare Other | Source: Ambulatory Visit

## 2016-04-10 DIAGNOSIS — Z1231 Encounter for screening mammogram for malignant neoplasm of breast: Secondary | ICD-10-CM

## 2016-07-07 ENCOUNTER — Emergency Department (HOSPITAL_BASED_OUTPATIENT_CLINIC_OR_DEPARTMENT_OTHER)
Admission: EM | Admit: 2016-07-07 | Discharge: 2016-07-07 | Disposition: A | Discharge status: Home / Self Care | Payer: Medicare Other | Attending: Emergency Medicine | Admitting: Emergency Medicine

## 2016-07-07 ENCOUNTER — Encounter (HOSPITAL_BASED_OUTPATIENT_CLINIC_OR_DEPARTMENT_OTHER): Payer: Self-pay | Admitting: *Deleted

## 2016-07-07 DIAGNOSIS — Y939 Activity, unspecified: Secondary | ICD-10-CM | POA: Insufficient documentation

## 2016-07-07 DIAGNOSIS — Y999 Unspecified external cause status: Secondary | ICD-10-CM | POA: Diagnosis not present

## 2016-07-07 DIAGNOSIS — R21 Rash and other nonspecific skin eruption: Secondary | ICD-10-CM | POA: Insufficient documentation

## 2016-07-07 DIAGNOSIS — I10 Essential (primary) hypertension: Secondary | ICD-10-CM | POA: Diagnosis not present

## 2016-07-07 DIAGNOSIS — W57XXXA Bitten or stung by nonvenomous insect and other nonvenomous arthropods, initial encounter: Secondary | ICD-10-CM | POA: Diagnosis not present

## 2016-07-07 DIAGNOSIS — Y929 Unspecified place or not applicable: Secondary | ICD-10-CM | POA: Insufficient documentation

## 2016-07-07 DIAGNOSIS — S70361A Insect bite (nonvenomous), right thigh, initial encounter: Secondary | ICD-10-CM | POA: Diagnosis present

## 2016-07-07 NOTE — ED Provider Notes (Signed)
Emergency Department Provider Note   I have reviewed the triage vital signs and the nursing notes.   HISTORY  Chief Complaint Insect Bite   HPI Tammy Hampton is a 82 y.o. female with past medical history of hypertension presents to the emergency department for evaluation of rash over her left upper thigh. She noticed it 3 days ago and it turned red today. She is concerned for an attached tick. No fever. No drainage from the wound. She scratched at it somewhat without bleeding. Has not seen additional ticks on her body. No migrating rash. No joint pain. No headache or neck pain. She is tried watchful waiting with no relief in symptoms. No exacerbating or alleviating factors. No pain or itching.   Past Medical History:  Diagnosis Date  . Glaucoma   . Hypertension   . Mitral valve prolapse     There are no active problems to display for this patient.   Past Surgical History:  Procedure Laterality Date  . ABDOMINAL HYSTERECTOMY    . APPENDECTOMY    . CHOLECYSTECTOMY    . THYROIDECTOMY  1967    Current Outpatient Rx  . Order #: [id number] Class: Historical Med  . Order #: [id number] Class: Historical Med  . Order #: [id number] Class: Historical Med  . Order #: [id number] Class: Historical Med  . Order #: [id number] Class: Historical Med  . Order #: [id number] Class: Historical Med  . Order #: [id number] Class: Historical Med  . Order #: [id number] Class: Historical Med    Allergies Macrobid [nitrofurantoin monohyd macro]; Macrolides and ketolides; Penicillins; Sulfa antibiotics; and Zithromax [azithromycin]  No family history on file.  Social History Social History  Substance Use Topics  . Smoking status: Never Smoker  . Smokeless tobacco: Not on file  . Alcohol use No    Review of Systems  Constitutional: No fever/chills Eyes: No visual changes. ENT: No sore throat. Cardiovascular: Denies chest pain. Respiratory: Denies shortness of breath. Gastrointestinal: No  abdominal pain.  No nausea, no vomiting.  No diarrhea.  No constipation. Genitourinary: Negative for dysuria. Musculoskeletal: Negative for back pain. Skin: Small punctate area to the right upper thigh.  Neurological: Negative for headaches, focal weakness or numbness.  10-point ROS otherwise negative.  ____________________________________________   PHYSICAL EXAM:  VITAL SIGNS: ED Triage Vitals  Enc Vitals Group     BP 07/07/16 1503 161/72     Pulse Rate 07/07/16 1503 72     Resp 07/07/16 1503 16     Temp 07/07/16 1503 98.2 F (36.8 C)     Temp Source 07/07/16 1503 Oral     SpO2 07/07/16 1503 99 %     Weight 07/07/16 1502 125 lb (56.7 kg)     Height 07/07/16 1502 5\' 2"  (1.575 m)   Constitutional: Alert and oriented. Well appearing and in no acute distress. Eyes: Conjunctivae are normal.  Head: Atraumatic. Nose: No congestion/rhinnorhea. Mouth/Throat: Mucous membranes are moist.  Oropharynx non-erythematous. Cardiovascular: Normal rate, regular rhythm. Good peripheral circulation. Grossly normal heart sounds.   Respiratory: Normal respiratory effort.  No retractions. Lungs CTAB. Gastrointestinal: Soft and nontender. No distention.  Musculoskeletal: No lower extremity tenderness nor edema. No gross deformities of extremities. Neurologic:  Normal speech and language. No gross focal neurologic deficits are appreciated.  Skin:  Skin is warm, dry and intact. Punctate 1 mm area over the anterior proximal right thigh. Small scab area over the lesion. No drainage. No fluctuance. No induration. No obvious foreign body or attached insect.  Psychiatric: Mood and affect  are normal. Speech and behavior are normal.  ____________________________________________   PROCEDURES  Procedure(s) performed:   Procedures  None ____________________________________________   INITIAL IMPRESSION / ASSESSMENT AND PLAN / ED COURSE  Pertinent labs & imaging results that were available during my  care of the patient were reviewed by me and considered in my medical decision making (see chart for details).  Patient resents the emergency department for evaluation of rash to the upper right lower extremity. No evidence of attached tick. No evidence of abscess. Discussed return precautions for worsening rash, tick borne illness, or abscess. We will have the patient monitor symptoms at home and return to the emergency department or her primary care physician as needed.    ____________________________________________  FINAL CLINICAL IMPRESSION(S) / ED DIAGNOSES  Final diagnoses:  Rash     MEDICATIONS GIVEN DURING THIS VISIT:  None   NEW OUTPATIENT MEDICATIONS STARTED DURING THIS VISIT:  None   Note:  This document was prepared using Dragon voice recognition software and may include unintentional dictation errors.  Nanda Quinton, MD Emergency Medicine   Margette Fast, MD 07/07/16 [phone]

## 2016-07-07 NOTE — ED Triage Notes (Signed)
Pt c/o ? Insect bite to right levis area x 2 days

## 2016-07-07 NOTE — Discharge Instructions (Signed)
You were seen in the ED today with rash. I saw no evidence of an attached tick. Keep watching the wound a return with any fever, chills, worsening redness, or drainage from the area.

## 2016-07-07 NOTE — ED Notes (Signed)
MD at the bedside and assessing the patient. The patient in no distress sitting in chair fully dressed

## 2017-03-05 ENCOUNTER — Other Ambulatory Visit: Payer: Self-pay | Admitting: Internal Medicine

## 2017-03-05 DIAGNOSIS — Z1231 Encounter for screening mammogram for malignant neoplasm of breast: Secondary | ICD-10-CM

## 2017-04-11 ENCOUNTER — Ambulatory Visit
Admission: RE | Admit: 2017-04-11 | Discharge: 2017-04-11 | Disposition: A | Discharge status: Home / Self Care | Payer: Medicare Other | Source: Ambulatory Visit | Attending: Internal Medicine | Admitting: Internal Medicine

## 2017-04-11 DIAGNOSIS — Z1231 Encounter for screening mammogram for malignant neoplasm of breast: Secondary | ICD-10-CM

## 2017-07-29 ENCOUNTER — Encounter (HOSPITAL_BASED_OUTPATIENT_CLINIC_OR_DEPARTMENT_OTHER): Payer: Self-pay | Admitting: Emergency Medicine

## 2017-07-29 ENCOUNTER — Inpatient Hospital Stay (HOSPITAL_BASED_OUTPATIENT_CLINIC_OR_DEPARTMENT_OTHER)
Admission: EM | Admit: 2017-07-29 | Discharge: 2017-07-31 | DRG: 641 | Disposition: A | Discharge status: Home / Self Care | Payer: Medicare Other | Attending: Internal Medicine | Admitting: Internal Medicine

## 2017-07-29 ENCOUNTER — Emergency Department (HOSPITAL_BASED_OUTPATIENT_CLINIC_OR_DEPARTMENT_OTHER): Payer: Medicare Other

## 2017-07-29 DIAGNOSIS — Z9049 Acquired absence of other specified parts of digestive tract: Secondary | ICD-10-CM

## 2017-07-29 DIAGNOSIS — H409 Unspecified glaucoma: Secondary | ICD-10-CM | POA: Diagnosis present

## 2017-07-29 DIAGNOSIS — Z880 Allergy status to penicillin: Secondary | ICD-10-CM

## 2017-07-29 DIAGNOSIS — R531 Weakness: Secondary | ICD-10-CM

## 2017-07-29 DIAGNOSIS — I10 Essential (primary) hypertension: Secondary | ICD-10-CM | POA: Diagnosis present

## 2017-07-29 DIAGNOSIS — R51 Headache: Secondary | ICD-10-CM | POA: Diagnosis not present

## 2017-07-29 DIAGNOSIS — E785 Hyperlipidemia, unspecified: Secondary | ICD-10-CM | POA: Diagnosis present

## 2017-07-29 DIAGNOSIS — Z79899 Other long term (current) drug therapy: Secondary | ICD-10-CM | POA: Diagnosis not present

## 2017-07-29 DIAGNOSIS — Z882 Allergy status to sulfonamides status: Secondary | ICD-10-CM

## 2017-07-29 DIAGNOSIS — R05 Cough: Secondary | ICD-10-CM

## 2017-07-29 DIAGNOSIS — R5381 Other malaise: Secondary | ICD-10-CM | POA: Diagnosis present

## 2017-07-29 DIAGNOSIS — I447 Left bundle-branch block, unspecified: Secondary | ICD-10-CM | POA: Diagnosis present

## 2017-07-29 DIAGNOSIS — Z90710 Acquired absence of both cervix and uterus: Secondary | ICD-10-CM

## 2017-07-29 DIAGNOSIS — Z888 Allergy status to other drugs, medicaments and biological substances status: Secondary | ICD-10-CM

## 2017-07-29 DIAGNOSIS — E039 Hypothyroidism, unspecified: Secondary | ICD-10-CM | POA: Diagnosis not present

## 2017-07-29 DIAGNOSIS — E890 Postprocedural hypothyroidism: Secondary | ICD-10-CM | POA: Diagnosis not present

## 2017-07-29 DIAGNOSIS — E871 Hypo-osmolality and hyponatremia: Principal | ICD-10-CM

## 2017-07-29 DIAGNOSIS — R059 Cough, unspecified: Secondary | ICD-10-CM

## 2017-07-29 DIAGNOSIS — R0981 Nasal congestion: Secondary | ICD-10-CM

## 2017-07-29 LAB — URINALYSIS, ROUTINE W REFLEX MICROSCOPIC
BILIRUBIN URINE: NEGATIVE
GLUCOSE, UA: NEGATIVE mg/dL
HGB URINE DIPSTICK: NEGATIVE
KETONES UR: NEGATIVE mg/dL
Leukocytes, UA: NEGATIVE
Nitrite: NEGATIVE
Protein, ur: NEGATIVE mg/dL
Specific Gravity, Urine: <1.005 — ABNORMAL LOW (ref 1.005–1.030)
pH: 6.5 (ref 5.0–8.0)

## 2017-07-29 LAB — COMPREHENSIVE METABOLIC PANEL
ALBUMIN: 3.8 g/dL (ref 3.5–5.0)
ALK PHOS: 56 U/L (ref 38–126)
ALT: 21 U/L (ref 14–54)
AST: 22 U/L (ref 15–41)
Anion gap: 9 (ref 5–15)
BUN: 23 mg/dL — AB (ref 6–20)
CALCIUM: 8.9 mg/dL (ref 8.9–10.3)
CO2: 24 mmol/L (ref 22–32)
CREATININE: 0.7 mg/dL (ref 0.44–1.00)
Chloride: 90 mmol/L — ABNORMAL LOW (ref 101–111)
GFR calc Af Amer: >60 mL/min (ref >60)
GFR calc non Af Amer: >60 mL/min (ref >60)
GLUCOSE: 124 mg/dL — AB (ref 65–99)
Potassium: 4.5 mmol/L (ref 3.5–5.1)
SODIUM: 123 mmol/L — AB (ref 135–145)
Total Bilirubin: 0.9 mg/dL (ref 0.3–1.2)
Total Protein: 6.7 g/dL (ref 6.5–8.1)

## 2017-07-29 LAB — BASIC METABOLIC PANEL
ANION GAP: 7 (ref 5–15)
BUN: 20 mg/dL (ref 6–20)
CALCIUM: 8.8 mg/dL — AB (ref 8.9–10.3)
CO2: 25 mmol/L (ref 22–32)
Chloride: 96 mmol/L — ABNORMAL LOW (ref 101–111)
Creatinine, Ser: 0.6 mg/dL (ref 0.44–1.00)
GFR calc Af Amer: >60 mL/min (ref >60)
GLUCOSE: 115 mg/dL — AB (ref 65–99)
POTASSIUM: 4.2 mmol/L (ref 3.5–5.1)
SODIUM: 128 mmol/L — AB (ref 135–145)

## 2017-07-29 LAB — CBC WITH DIFFERENTIAL/PLATELET
BASOS PCT: 0 %
Basophils Absolute: 0 10*3/uL (ref 0.0–0.1)
EOS ABS: 0 10*3/uL (ref 0.0–0.7)
Eosinophils Relative: 1 %
HCT: 35 % — ABNORMAL LOW (ref 36.0–46.0)
HEMOGLOBIN: 12.4 g/dL (ref 12.0–15.0)
LYMPHS ABS: 1.3 10*3/uL (ref 0.7–4.0)
Lymphocytes Relative: 15 %
MCH: 32 pg (ref 26.0–34.0)
MCHC: 35.4 g/dL (ref 30.0–36.0)
MCV: 90.2 fL (ref 78.0–100.0)
Monocytes Absolute: 0.9 10*3/uL (ref 0.1–1.0)
Monocytes Relative: 10 %
NEUTROS ABS: 6.4 10*3/uL (ref 1.7–7.7)
NEUTROS PCT: 74 %
Platelets: 270 10*3/uL (ref 150–400)
RBC: 3.88 MIL/uL (ref 3.87–5.11)
RDW: 11.9 % (ref 11.5–15.5)
WBC: 8.6 10*3/uL (ref 4.0–10.5)

## 2017-07-29 MED ORDER — ONDANSETRON HCL 4 MG/2ML IJ SOLN: 4.0000 mg | Freq: Four times a day (QID) | INTRAMUSCULAR | Status: DC | PRN

## 2017-07-29 MED ORDER — SODIUM CHLORIDE 0.9 % IV BOLUS (SEPSIS)
1000.0000 mL | Freq: Once | INTRAVENOUS | Status: AC
  Administered 2017-07-29: 1000 mL via INTRAVENOUS

## 2017-07-29 MED ORDER — LATANOPROST 0.005 % OP SOLN
1.0000 [drp] | Freq: Every day | OPHTHALMIC | Status: DC
  Administered 2017-07-30: 1 [drp] via OPHTHALMIC
  Filled 2017-07-29: qty 2.5

## 2017-07-29 MED ORDER — SODIUM CHLORIDE 0.9 % IV SOLN: Freq: Once | INTRAVENOUS | Status: DC

## 2017-07-29 MED ORDER — DORZOLAMIDE HCL-TIMOLOL MAL 2-0.5 % OP SOLN
1.0000 [drp] | Freq: Two times a day (BID) | OPHTHALMIC | Status: DC
  Administered 2017-07-30 (×2): 1 [drp] via OPHTHALMIC
  Filled 2017-07-29: qty 10

## 2017-07-29 MED ORDER — SODIUM CHLORIDE 0.9 % IV SOLN: INTRAVENOUS | Status: DC

## 2017-07-29 MED ORDER — SODIUM CHLORIDE 0.9 % IV BOLUS (SEPSIS): 1000.0000 mL | Freq: Once | INTRAVENOUS | Status: DC

## 2017-07-29 MED ORDER — ONDANSETRON HCL 4 MG PO TABS: 4.0000 mg | ORAL_TABLET | Freq: Four times a day (QID) | ORAL | Status: DC | PRN

## 2017-07-29 MED ORDER — LOSARTAN POTASSIUM 50 MG PO TABS
50.0000 mg | ORAL_TABLET | Freq: Every day | ORAL | Status: DC
  Administered 2017-07-30: 50 mg via ORAL
  Filled 2017-07-29: qty 1

## 2017-07-29 MED ORDER — SODIUM CHLORIDE 0.9 % IV SOLN: 1000.0000 mL | INTRAVENOUS | Status: DC

## 2017-07-29 MED ORDER — ACETAMINOPHEN 325 MG PO TABS
650.0000 mg | ORAL_TABLET | Freq: Four times a day (QID) | ORAL | Status: DC | PRN
  Administered 2017-07-30: 650 mg via ORAL
  Filled 2017-07-29: qty 2

## 2017-07-29 MED ORDER — SENNOSIDES-DOCUSATE SODIUM 8.6-50 MG PO TABS: 1.0000 | ORAL_TABLET | Freq: Every evening | ORAL | Status: DC | PRN

## 2017-07-29 MED ORDER — ACETAMINOPHEN 650 MG RE SUPP: 650.0000 mg | Freq: Four times a day (QID) | RECTAL | Status: DC | PRN

## 2017-07-29 MED ORDER — OMEGA-3-ACID ETHYL ESTERS 1 G PO CAPS
1.0000 g | ORAL_CAPSULE | Freq: Every day | ORAL | Status: DC
  Administered 2017-07-29 – 2017-07-31 (×3): 1 g via ORAL
  Filled 2017-07-29 (×3): qty 1

## 2017-07-29 MED ORDER — LEVOTHYROXINE SODIUM 100 MCG PO TABS
100.0000 ug | ORAL_TABLET | Freq: Every day | ORAL | Status: DC
  Administered 2017-07-30 – 2017-07-31 (×2): 100 ug via ORAL
  Filled 2017-07-29 (×2): qty 1

## 2017-07-29 MED ORDER — HYDROCODONE-ACETAMINOPHEN 5-325 MG PO TABS
1.0000 | ORAL_TABLET | ORAL | Status: DC | PRN
  Administered 2017-07-29: 1 via ORAL
  Filled 2017-07-29: qty 1

## 2017-07-29 MED ORDER — PRAVASTATIN SODIUM 20 MG PO TABS
20.0000 mg | ORAL_TABLET | Freq: Every day | ORAL | Status: DC
  Administered 2017-07-29 – 2017-07-30 (×2): 20 mg via ORAL
  Filled 2017-07-29 (×2): qty 1

## 2017-07-29 MED ORDER — ENOXAPARIN SODIUM 40 MG/0.4ML ~~LOC~~ SOLN
40.0000 mg | SUBCUTANEOUS | Status: DC
  Administered 2017-07-29 – 2017-07-30 (×2): 40 mg via SUBCUTANEOUS
  Filled 2017-07-29 (×2): qty 0.4

## 2017-07-29 MED ORDER — BISACODYL 5 MG PO TBEC: 5.0000 mg | DELAYED_RELEASE_TABLET | Freq: Every day | ORAL | Status: DC | PRN

## 2017-07-29 MED ORDER — SODIUM CHLORIDE 0.9 % IV SOLN
1000.0000 mL | INTRAVENOUS | Status: DC
  Administered 2017-07-29: 1000 mL via INTRAVENOUS

## 2017-07-29 NOTE — ED Provider Notes (Signed)
[facility] DEPT [facility] Provider Note   CSN: [id number] Arrival date & time: 07/29/17  1349     History   Chief Complaint Chief Complaint  Patient presents with  . Medication Reaction    HPI Tammy Hampton is a 83 y.o. female.  HPI  83 year old female who presents with concern for medication reaction. She has a history of hypertension and mitral valve prolapse. States that about a week ago she was started on clarithromycin by her allergist to treat possible bacterial rhinitis. States that about 2 days ago, she began to feel weak, fatigued, with worsening cough, congestion, and runny nose. She thinks it may be related to taking the erythromycin. Denies any fevers, nausea or vomiting, abdominal pain, diarrhea, difficulty breathing.  Past Medical History:  Diagnosis Date  . Glaucoma   . Hypertension   . Mitral valve prolapse     There are no active problems to display for this patient.   Past Surgical History:  Procedure Laterality Date  . ABDOMINAL HYSTERECTOMY    . APPENDECTOMY    . BREAST EXCISIONAL BIOPSY     left 1990 right 1964  . CHOLECYSTECTOMY    . THYROIDECTOMY  1967    OB History    No data available       Home Medications    Prior to Admission medications   Medication Sig Start Date End Date Taking? Authorizing Provider  clarithromycin (BIAXIN) 500 MG tablet Take 500 mg by mouth 2 (two) times daily.   Yes [provider]  co-enzyme Q-10 50 MG capsule Take 100 mg by mouth daily.    [provider]  dorzolamide-timolol (COSOPT) 22.3-6.8 MG/ML ophthalmic solution Place 1 drop into both eyes 2 (two) times daily.    [provider]  latanoprost (XALATAN) 0.005 % ophthalmic solution Place 1 drop into both eyes at bedtime.    [provider]  levothyroxine (SYNTHROID, LEVOTHROID) 100 MCG tablet Take 100 mcg by mouth daily before breakfast.    [provider]  losartan (COZAAR) 50 MG tablet Take 50 mg by mouth  daily.    [provider]  lovastatin (MEVACOR) 20 MG tablet Take 20 mg by mouth at bedtime.    [provider]  Omega-3 Fatty Acids (FISH OIL) 1000 MG CAPS Take 1 capsule by mouth daily.    [provider]  Probiotic Product (PROBIOTIC DAILY PO) Take 1 capsule by mouth at bedtime.    [provider]    Family History History reviewed. No pertinent family history.  Social History Social History  Substance Use Topics  . Smoking status: Never Smoker  . Smokeless tobacco: Never Used  . Alcohol use No     Allergies   Macrobid [nitrofurantoin monohyd macro]; Macrolides and ketolides; Penicillins; Sulfa antibiotics; and Zithromax [azithromycin]   Review of Systems Review of Systems  Constitutional: Negative for fever.  HENT: Positive for congestion.   Respiratory: Positive for cough. Negative for shortness of breath.   Cardiovascular: Negative for leg swelling.  Gastrointestinal: Negative for abdominal pain, diarrhea, nausea and vomiting.  Genitourinary: Positive for frequency. Negative for dysuria.  Musculoskeletal: Negative for back pain.  All other systems reviewed and are negative.    Physical Exam Updated Vital Signs BP (!) 166/71 (BP Location: Right Arm)   Pulse 62   Temp 98 F (36.7 C) (Oral)   Resp 18   Ht 5\' 2"  (1.575 m)   Wt 57.2 kg (126 lb)   SpO2 100%  BMI 23.05 kg/m   Physical Exam Physical Exam  Nursing note and vitals reviewed. Constitutional: Well developed, well nourished, non-toxic, and in no acute distress Head: Normocephalic and atraumatic.  Mouth/Throat: Oropharynx is clear and moist.  Neck: Normal range of motion. Neck supple.  Cardiovascular: Normal rate and regular rhythm.   Pulmonary/Chest: Effort normal and breath sounds normal.  Abdominal: Soft. There is no tenderness. There is no rebound and no guarding.  Musculoskeletal: Normal range of motion.  Neurological: Alert, no facial droop, fluent speech,  moves all extremities symmetrically Skin: Skin is warm and dry.  Psychiatric: Cooperative   ED Treatments / Results  Labs (all labs ordered are listed, but only abnormal results are displayed) Labs Reviewed  CBC WITH DIFFERENTIAL/PLATELET - Abnormal; Notable for the following:       Result Value   HCT 35.0 (*)    All other components within normal limits  URINALYSIS, ROUTINE W REFLEX MICROSCOPIC - Abnormal; Notable for the following:    Specific Gravity, Urine <1.005 (*)    All other components within normal limits  COMPREHENSIVE METABOLIC PANEL    EKG  EKG Interpretation None       Radiology Dg Chest 2 View  Result Date: 07/29/2017 CLINICAL DATA:  Cough. EXAM: CHEST  2 VIEW COMPARISON:  Radiographs of July 10, 2014. FINDINGS: The heart size and mediastinal contours are within normal limits. Both lungs are clear. Atherosclerosis of thoracic aorta is noted. No pneumothorax or pleural effusion is noted. The visualized skeletal structures are unremarkable. IMPRESSION: No active cardiopulmonary disease.  Aortic atherosclerosis. Electronically Signed   By: Marijo Conception, M.D.   On: 07/29/2017 15:02    Procedures Procedures (including critical care time)  Medications Ordered in ED Medications - No data to display   Initial Impression / Assessment and Plan / ED Course  I have reviewed the triage vital signs and the nursing notes.  Pertinent labs & imaging results that were available during my care of the patient were reviewed by me and considered in my medical decision making (see chart for details).     83 year old female who presents with 2-3 days of worsening cough, congestion, runny nose and fatigue. She is well appearing and in no acute distress. Ambulatory steadily from triage to room. Exam overall non-focal.  Seems less likely that symptoms are related to her clarithromycin.   Will chest blood work, CXR, and UA. CXR visualized and shows no acute cardiopulmonary  processes.   Blood work concerning for new hyponatremia of 123. She reports that she has been keeping well hydrated, and feel this is less likely to be hypovolemic. Does not appear that hyponatremia is a common side effect for her clarithromycin.  Given degree of hyponatremia plan to admit for observation as this is likely etiology of her symptoms.   Final Clinical Impressions(s) / ED Diagnoses   Final diagnoses:  Cough  Congestion of nasal sinus  Generalized weakness    New Prescriptions New Prescriptions   No medications on file     Forde Dandy, MD 07/29/17 [phone]

## 2017-07-29 NOTE — H&P (Signed)
History and Physical    Tammy Hampton:[id number] DOB: 21-Dec-1930 DOA: 07/29/2017  PCP: Jani Gravel, MD   Patient coming from: Home, by way of [facility]   Chief Complaint: Fatigue, malaise   HPI: SEVEN MARENGO is a 83 y.o. female with medical history significant for hypertension and hypothyroidism, now presenting to the emergency department for evaluation of fatigue and malaise. She reports suffering from recent URI symptoms with copious rhinorrhea and was started on a fluoroquinolone by her PCP approximately one week ago for this. Those symptoms seemed to be improving, but she has noted the insidious development of fatigue and a nonspecific malaise. Denies diarrhea or vomiting. Mild intermittent frontal headache without vision or hearing change, and without focal numbness or weakness. No fevers, chills, chest pain, cough, or dyspnea.   [facility] ED Course: Upon arrival to the ED, patient is found to be afebrile, saturating well on room air, and with vital signs otherwise stable. Chest x-ray is negative for acute cardiopulmonary disease. Chemistry panel reveals a sodium of 123 and BUN to creatinine ratio of 33. CBC is unremarkable. Urinalysis is notable for a low specific gravity. Patient was treated with a liter of normal saline and continued on normal saline infusion in the ED. She remained hemodynamically stable and in no apparent respiratory distress. She will be admitted to the medical-surgical unit for ongoing evaluation and management of fatigue and malaise, likely secondary to hyponatremia.   Review of Systems:  All other systems reviewed and apart from HPI, are negative.  Past Medical History:  Diagnosis Date  . Glaucoma   . Hypertension   . Mitral valve prolapse     Past Surgical History:  Procedure Laterality Date  . ABDOMINAL HYSTERECTOMY    . APPENDECTOMY    . BREAST EXCISIONAL BIOPSY     left 1990 right 1964  . CHOLECYSTECTOMY    . THYROIDECTOMY  1967       reports that she has never smoked. She has never used smokeless tobacco. She reports that she does not drink alcohol or use drugs.  Allergies  Allergen Reactions  . Macrobid [Nitrofurantoin Monohyd Macro] Itching  . Macrolides And Ketolides   . Penicillins     Has patient had a PCN reaction causing immediate rash, facial/tongue/throat swelling, SOB or lightheadedness with hypotension: Unknown Has patient had a PCN reaction causing severe rash involving mucus membranes or skin necrosis: unknown Has patient had a PCN reaction that required hospitalization: Unknown Has patient had a PCN reaction occurring within the last 10 years: unknown If all of the above answers are "NO", then may proceed with Cephalosporin use.   . Sulfa Antibiotics   . Zithromax [Azithromycin] Itching    History reviewed. No pertinent family history.   Prior to Admission medications   Medication Sig Start Date End Date Taking? Authorizing Provider  clarithromycin (BIAXIN) 500 MG tablet Take 500 mg by mouth 2 (two) times daily.   Yes [provider]  co-enzyme Q-10 50 MG capsule Take 100 mg by mouth daily.    [provider]  dorzolamide-timolol (COSOPT) 22.3-6.8 MG/ML ophthalmic solution Place 1 drop into both eyes 2 (two) times daily.    [provider]  latanoprost (XALATAN) 0.005 % ophthalmic solution Place 1 drop into both eyes at bedtime.    [provider]  levothyroxine (SYNTHROID, LEVOTHROID) 100 MCG tablet Take 100 mcg by mouth daily before breakfast.    [provider]  losartan (COZAAR) 50 MG  tablet Take 50 mg by mouth daily.    [provider]  lovastatin (MEVACOR) 20 MG tablet Take 20 mg by mouth at bedtime.    [provider]  Omega-3 Fatty Acids (FISH OIL) 1000 MG CAPS Take 1 capsule by mouth daily.    [provider]  Probiotic Product (PROBIOTIC DAILY PO) Take 1 capsule by mouth at bedtime.    [provider]     Physical Exam: Vitals:   07/29/17 1358 07/29/17 1708 07/29/17 1941 07/29/17 2028  BP: (!) 166/71 (!) 152/62 125/68 127/63  Pulse: 62 66 66 67  Resp: 18 18  16   Temp: 98 F (36.7 C)  98.6 F (37 C) 97.6 F (36.4 C)  TempSrc: Oral  Oral Oral  SpO2: 100% 100% 96% 99%  Weight: 57.2 kg (126 lb)   57 kg (125 lb 9.6 oz)  Height: 5\' 2"  (1.575 m)   5\' 2"  (1.575 m)      Constitutional: NAD, calm, in apparent discomfort Eyes: PERTLA, lids and conjunctivae normal ENMT: Mucous membranes are moist. Posterior pharynx clear of any exudate or lesions.   Neck: normal, supple, no masses, no thyromegaly Respiratory: clear to auscultation bilaterally, no wheezing, no crackles. Normal respiratory effort.  Cardiovascular: S1 & S2 heard, regular rate and rhythm. No extremity edema. No significant JVD. Abdomen: No distension, no tenderness, no masses palpated. Bowel sounds normal.  Musculoskeletal: no clubbing / cyanosis. No joint deformity upper and lower extremities.  Skin: no significant rashes, lesions, ulcers. Poor turgor. Neurologic: CN 2-12 grossly intact. Sensation intact. Strength 5/5 in all 4 limbs.  Psychiatric: Alert and oriented x 3. Pleasant, cooperative.     Labs on Admission: I have personally reviewed following labs and imaging studies  CBC:  Recent Labs Lab 07/29/17 1500  WBC 8.6  NEUTROABS 6.4  HGB 12.4  HCT 35.0*  MCV 90.2  PLT 381   Basic Metabolic Panel:  Recent Labs Lab 07/29/17 1545  NA 123*  K 4.5  CL 90*  CO2 24  GLUCOSE 124*  BUN 23*  CREATININE 0.70  CALCIUM 8.9   GFR: Estimated Creatinine Clearance: 41.4 mL/min (by C-G formula based on SCr of 0.7 mg/dL). Liver Function Tests:  Recent Labs Lab 07/29/17 1545  AST 22  ALT 21  ALKPHOS 56  BILITOT 0.9  PROT 6.7  ALBUMIN 3.8   No results for input(s): LIPASE, AMYLASE in the last 168 hours. No results for input(s): AMMONIA in the last 168 hours. Coagulation Profile: No results for  input(s): INR, PROTIME in the last 168 hours. Cardiac Enzymes: No results for input(s): CKTOTAL, CKMB, CKMBINDEX, TROPONINI in the last 168 hours. BNP (last 3 results) No results for input(s): PROBNP in the last 8760 hours. HbA1C: No results for input(s): HGBA1C in the last 72 hours. CBG: No results for input(s): GLUCAP in the last 168 hours. Lipid Profile: No results for input(s): CHOL, HDL, LDLCALC, TRIG, CHOLHDL, LDLDIRECT in the last 72 hours. Thyroid Function Tests: No results for input(s): TSH, T4TOTAL, FREET4, T3FREE, THYROIDAB in the last 72 hours. Anemia Panel: No results for input(s): VITAMINB12, FOLATE, FERRITIN, TIBC, IRON, RETICCTPCT in the last 72 hours. Urine analysis:    Component Value Date/Time   COLORURINE YELLOW 07/29/2017 [place] 07/29/2017 1434   LABSPEC <1.005 (L) 07/29/2017 1434   PHURINE 6.5 07/29/2017 1434   GLUCOSEU NEGATIVE 07/29/2017 1434   HGBUR NEGATIVE 07/29/2017 [place] 07/29/2017 [place] 07/29/2017  [place] 07/29/2017 1434   UROBILINOGEN 0.2 07/10/2014 1531   NITRITE NEGATIVE 07/29/2017 1434   LEUKOCYTESUR NEGATIVE 07/29/2017 1434   Sepsis Labs: @LABRCNTIP (procalcitonin:4,lacticidven:4) )No results found for this or any previous visit (from the past 240 hour(s)).   Radiological Exams on Admission: Dg Chest 2 View  Result Date: 07/29/2017 CLINICAL DATA:  Cough. EXAM: CHEST  2 VIEW COMPARISON:  Radiographs of July 10, 2014. FINDINGS: The heart size and mediastinal contours are within normal limits. Both lungs are clear. Atherosclerosis of thoracic aorta is noted. No pneumothorax or pleural effusion is noted. The visualized skeletal structures are unremarkable. IMPRESSION: No active cardiopulmonary disease.  Aortic atherosclerosis. Electronically Signed   By: Marijo Conception, M.D.   On: 07/29/2017 15:02    EKG: Not performed.   Assessment/Plan  1. Hyponatremia  - Pt  presents with fatigue and malaise, found to have serum sodium of 123 with no recent labs for comparison  - BUN:Cr ratio is 33 and there is poor skin turgor suggestive of hypovolemia, likely from recent acute URI with poor appetite and copious rhinorrhea - She was treated in ED with 1 liter of NS and continued on NS infusion  - Urine osm, urine sodium, and THS are pending, will check serum osmolality  - Continue IVF hydration and follow serial chem panels  - Goal will be for serum sodium of ~130 for the afternoon of 07/30/17; will repeat chem panel overnight to avoid an over-rapid correction    2. Hypertension  - BP at goal  - Continue losartan    3. Hypothyroidism  - Check TSH given the presentation with hyponatremia  - Continue Synthroid    DVT prophylaxis: sq Lovenox Code Status: Full  Family Communication: Discussed with patient Disposition Plan: Observe on med-surg Consults called: None Admission status: Observation    Vianne Bulls, MD [facility] Pager [phone]  If 7PM-7AM, please contact night-coverage [email or web address] Password [facility]  07/29/2017, 8:43 PM

## 2017-07-29 NOTE — ED Triage Notes (Signed)
Patient states that since she has started a new anitibiotic she has had generalized fatigue and aches and pain. The patient also reports that she feels like it may have caused her to be constipated

## 2017-07-29 NOTE — ED Notes (Signed)
Alert, NAD, calm, interactive, resps e/u, speaking in clear complete sentences, no dyspnea noted, skin W&D, VSS, (denies: pain, sob, nausea, dizziness or visual changes). Family at BS. 

## 2017-07-29 NOTE — Progress Notes (Signed)
Called by Dr.Pfeiffer from [facility] 86/F with HTn, recent sinus infection Rx with clarithromycin about a week ago, presents with Gen weakness Vitals stable, mentating well, labs notable for Na 123-etiology not clear, last Na was 132 but this was from 2015. Given IVF, ive ordered, UA, Urine Osm and urine Na and TSH Accepted to Med Surg bed at Lanice Shirts, MD

## 2017-07-29 NOTE — ED Notes (Signed)
carelink here 

## 2017-07-30 DIAGNOSIS — E871 Hypo-osmolality and hyponatremia: Secondary | ICD-10-CM | POA: Diagnosis not present

## 2017-07-30 DIAGNOSIS — E890 Postprocedural hypothyroidism: Secondary | ICD-10-CM | POA: Diagnosis not present

## 2017-07-30 DIAGNOSIS — R05 Cough: Secondary | ICD-10-CM | POA: Diagnosis not present

## 2017-07-30 DIAGNOSIS — R5381 Other malaise: Secondary | ICD-10-CM | POA: Diagnosis not present

## 2017-07-30 DIAGNOSIS — I10 Essential (primary) hypertension: Secondary | ICD-10-CM | POA: Diagnosis not present

## 2017-07-30 LAB — BASIC METABOLIC PANEL
ANION GAP: 7 (ref 5–15)
Anion gap: 8 (ref 5–15)
Anion gap: 7 (ref 5–15)
BUN: 19 mg/dL (ref 6–20)
BUN: 16 mg/dL (ref 6–20)
BUN: 17 mg/dL (ref 6–20)
CHLORIDE: 101 mmol/L (ref 101–111)
CHLORIDE: 98 mmol/L — AB (ref 101–111)
CHLORIDE: 97 mmol/L — AB (ref 101–111)
CO2: 23 mmol/L (ref 22–32)
CO2: 23 mmol/L (ref 22–32)
CO2: 22 mmol/L (ref 22–32)
CREATININE: 0.63 mg/dL (ref 0.44–1.00)
Calcium: 8.2 mg/dL — ABNORMAL LOW (ref 8.9–10.3)
Calcium: 8.3 mg/dL — ABNORMAL LOW (ref 8.9–10.3)
Calcium: 8.4 mg/dL — ABNORMAL LOW (ref 8.9–10.3)
Creatinine, Ser: 0.59 mg/dL (ref 0.44–1.00)
Creatinine, Ser: 0.7 mg/dL (ref 0.44–1.00)
GFR calc Af Amer: >60 mL/min (ref >60)
GFR calc non Af Amer: >60 mL/min (ref >60)
GFR calc non Af Amer: >60 mL/min (ref >60)
GFR calc non Af Amer: >60 mL/min (ref >60)
GLUCOSE: 119 mg/dL — AB (ref 65–99)
Glucose, Bld: 125 mg/dL — ABNORMAL HIGH (ref 65–99)
Glucose, Bld: 128 mg/dL — ABNORMAL HIGH (ref 65–99)
POTASSIUM: 4.2 mmol/L (ref 3.5–5.1)
POTASSIUM: 4.3 mmol/L (ref 3.5–5.1)
Potassium: 4.6 mmol/L (ref 3.5–5.1)
SODIUM: 126 mmol/L — AB (ref 135–145)
Sodium: 132 mmol/L — ABNORMAL LOW (ref 135–145)
Sodium: 128 mmol/L — ABNORMAL LOW (ref 135–145)

## 2017-07-30 LAB — CBC
HCT: 30.7 % — ABNORMAL LOW (ref 36.0–46.0)
Hemoglobin: 10.8 g/dL — ABNORMAL LOW (ref 12.0–15.0)
MCH: 31.1 pg (ref 26.0–34.0)
MCHC: 35.2 g/dL (ref 30.0–36.0)
MCV: 88.5 fL (ref 78.0–100.0)
PLATELETS: 223 10*3/uL (ref 150–400)
RBC: 3.47 MIL/uL — AB (ref 3.87–5.11)
RDW: 12.3 % (ref 11.5–15.5)
WBC: 6.4 10*3/uL (ref 4.0–10.5)

## 2017-07-30 LAB — TROPONIN I: Troponin I: <0.03 ng/mL (ref <0.03)

## 2017-07-30 LAB — OSMOLALITY, URINE: OSMOLALITY UR: 373 mosm/kg (ref 300–900)

## 2017-07-30 LAB — OSMOLALITY: OSMOLALITY: 271 mosm/kg — AB (ref 275–295)

## 2017-07-30 LAB — TSH: TSH: 0.371 u[IU]/mL (ref 0.350–4.500)

## 2017-07-30 LAB — SODIUM, URINE, RANDOM: Sodium, Ur: 59 mmol/L

## 2017-07-30 MED ORDER — SODIUM CHLORIDE 0.9 % IV SOLN
INTRAVENOUS | Status: DC
  Administered 2017-07-30: 13:00:00 via INTRAVENOUS

## 2017-07-30 MED ORDER — GI COCKTAIL ~~LOC~~
30.0000 mL | Freq: Once | ORAL | Status: AC
  Administered 2017-07-30: 30 mL via ORAL
  Filled 2017-07-30 (×2): qty 30

## 2017-07-30 MED ORDER — SODIUM CHLORIDE 0.9 % IV SOLN
INTRAVENOUS | Status: DC
  Administered 2017-07-30: 20:00:00 via INTRAVENOUS

## 2017-07-30 MED ORDER — SODIUM CHLORIDE 0.45 % IV SOLN
INTRAVENOUS | Status: DC
  Administered 2017-07-30: 05:00:00 via INTRAVENOUS

## 2017-07-30 NOTE — Progress Notes (Signed)
Patient complaining of feeling more light headed, pain in arms, head and left side of chest, Vitals signs and EKG done, labs ordered by DR Maylene Roes. Patient was sitting in chair, assisted back to bed.Patient does not appear to be in distress.

## 2017-07-30 NOTE — Progress Notes (Signed)
Patient resting in bed claims she feels better now.

## 2017-07-30 NOTE — Progress Notes (Addendum)
PROGRESS NOTE    Tammy Hampton  PJK:[id number] DOB: 25-Jul-1931 DOA: 07/29/2017 PCP: Jani Gravel, MD     Brief Narrative:  Tammy Hampton is a 83 y.o. female with medical history significant for hypertension and hypothyroidism, now presenting to the emergency department for evaluation of fatigue and malaise. She reports suffering from recent URI symptoms with copious rhinorrhea and was started on a fluoroquinolone by her allergist approximately one week ago for this. Those symptoms seemed to be improving, but she has noted the insidious development of fatigue and a nonspecific malaise. In the ED, serum sodium level was low at 123. Patient was referred for further evaluation.  Assessment & Plan:   Principal Problem:   Hyponatremia Active Problems:   Hypertension   Generalized weakness   Hypothyroidism   Hypotonic Hyponatremia -?SIADH. Urine sodium 59, urine osmol 373, serum osmol 271  -Initially improved with IVF, then worsened with fluid restriction. Will resume IVF and continue to trend BMP  Hypothyroidism -TSH normal -Continue synthroid  HTN -Continue Cozaar  Hyperlipidemia -Continue statin    DVT prophylaxis: lovenox Code Status: full Family Communication: at bedside Disposition Plan: pending improvement and stabilization, HHPT    Consultants:   None  Procedures:   None   Antimicrobials:  Anti-infectives    None       Subjective: Patient feeling well this morning, she states that she feels much better since she has been in the hospital. No complaints of chest pain or shortness of breath, nausea, vomiting, diarrhea or abdominal pain.  After working with physical therapy this morning, patient reported being very lightheaded and dizzy.  Objective: Vitals:   07/29/17 1941 07/29/17 2028 07/30/17 0522 07/30/17 1025  BP: 125/68 127/63 (!) 135/58 (!) 141/63  Pulse: 66 67 61 63  Resp:  16 15   Temp: 98.6 F (37 C) 97.6 F (36.4 C) 97.8 F (36.6 C)     TempSrc: Oral Oral Oral   SpO2: 96% 99% 94% 100%  Weight:  57 kg (125 lb 9.6 oz)    Height:  5\' 2"  (1.575 m)      Intake/Output Summary (Last 24 hours) at 07/30/17 1347 Last data filed at 07/30/17 0752  Gross per 24 hour  Intake          1089.58 ml  Output              300 ml  Net           789.58 ml   Filed Weights   07/29/17 1358 07/29/17 2028  Weight: 57.2 kg (126 lb) 57 kg (125 lb 9.6 oz)    Examination:  General exam: Appears calm and comfortable  Respiratory system: Clear to auscultation. Respiratory effort normal. Cardiovascular system: S1 & S2 heard, RRR. No JVD, murmurs, rubs, gallops or clicks. No pedal edema. Gastrointestinal system: Abdomen is nondistended, soft and nontender. No organomegaly or masses felt. Normal bowel sounds heard. Central nervous system: Alert and oriented. No focal neurological deficits. Extremities: Symmetric 5 x 5 power. Skin: No rashes, lesions or ulcers Psychiatry: Judgement and insight appear normal. Mood & affect appropriate.   Data Reviewed: I have personally reviewed following labs and imaging studies  CBC:  Recent Labs Lab 07/29/17 1500 07/30/17 0417  WBC 8.6 6.4  NEUTROABS 6.4  --   HGB 12.4 10.8*  HCT 35.0* 30.7*  MCV 90.2 88.5  PLT 270 809   Basic Metabolic Panel:  Recent Labs Lab 07/29/17 1545 07/29/17 2143 07/30/17 0417 07/30/17  1209  NA 123* 128* 132* 128*  K 4.5 4.2 4.6 4.2  CL 90* 96* 101 98*  CO2 24 25 23 23   GLUCOSE 124* 115* 125* 119*  BUN 23* 20 19 16   CREATININE 0.70 0.60 0.63 0.59  CALCIUM 8.9 8.8* 8.2* 8.3*   GFR: Estimated Creatinine Clearance: 41.4 mL/min (by C-G formula based on SCr of 0.59 mg/dL). Liver Function Tests:  Recent Labs Lab 07/29/17 1545  AST 22  ALT 21  ALKPHOS 56  BILITOT 0.9  PROT 6.7  ALBUMIN 3.8   No results for input(s): LIPASE, AMYLASE in the last 168 hours. No results for input(s): AMMONIA in the last 168 hours. Coagulation Profile: No results for input(s):  INR, PROTIME in the last 168 hours. Cardiac Enzymes:  Recent Labs Lab 07/30/17 0714  TROPONINI <0.03   BNP (last 3 results) No results for input(s): PROBNP in the last 8760 hours. HbA1C: No results for input(s): HGBA1C in the last 72 hours. CBG: No results for input(s): GLUCAP in the last 168 hours. Lipid Profile: No results for input(s): CHOL, HDL, LDLCALC, TRIG, CHOLHDL, LDLDIRECT in the last 72 hours. Thyroid Function Tests:  Recent Labs  07/29/17 1720  TSH 0.371   Anemia Panel: No results for input(s): VITAMINB12, FOLATE, FERRITIN, TIBC, IRON, RETICCTPCT in the last 72 hours. Sepsis Labs: No results for input(s): PROCALCITON, LATICACIDVEN in the last 168 hours.  No results found for this or any previous visit (from the past 240 hour(s)).     Radiology Studies: Dg Chest 2 View  Result Date: 07/29/2017 CLINICAL DATA:  Cough. EXAM: CHEST  2 VIEW COMPARISON:  Radiographs of July 10, 2014. FINDINGS: The heart size and mediastinal contours are within normal limits. Both lungs are clear. Atherosclerosis of thoracic aorta is noted. No pneumothorax or pleural effusion is noted. The visualized skeletal structures are unremarkable. IMPRESSION: No active cardiopulmonary disease.  Aortic atherosclerosis. Electronically Signed   By: Marijo Conception, M.D.   On: 07/29/2017 15:02      Scheduled Meds: . dorzolamide-timolol  1 drop Both Eyes BID  . enoxaparin (LOVENOX) injection  40 mg Subcutaneous Q24H  . latanoprost  1 drop Both Eyes QHS  . levothyroxine  100 mcg Oral QAC breakfast  . losartan  50 mg Oral Daily  . omega-3 acid ethyl esters  1 g Oral Daily  . pravastatin  20 mg Oral q1800   Continuous Infusions:    LOS: 1 day    Time spent: 40 minutes   Dessa Phi, DO [facility] [email or web address] Password [email or web address] 07/30/2017, 1:47 PM

## 2017-07-30 NOTE — Evaluation (Signed)
Physical Therapy Evaluation Patient Details Name: Tammy Hampton MRN: [id number] DOB: November 24, 1930 Today's Date: 07/30/2017   History of Present Illness  83 y.o. female with medical history significant for hypertension and hypothyroidism, now presenting to the emergency department for evaluation of fatigue and malaise; found to have hyponatremia  Clinical Impression  Pt admitted with above diagnosis. Pt currently with functional limitations due to the deficits listed below (see PT Problem List).  Pt Hampton benefit from skilled PT to increase their independence and safety with mobility to allow discharge to the venue listed below.  Pt assisted to [facility] and then back to bed.  Pt reports dizziness which did not resolve upon sitting and felt unable to tolerate ambulation.       Follow Up Recommendations Home health PT;Supervision for mobility/OOB    Equipment Recommendations  None recommended by PT    Recommendations for Other Services       Precautions / Restrictions Precautions Precautions: Fall      Mobility  Bed Mobility Overal bed mobility: Needs Assistance Bed Mobility: Supine to Sit;Sit to Supine     Supine to sit: Supervision Sit to supine: Supervision      Transfers Overall transfer level: Needs assistance Equipment used: None Transfers: Sit to/from [employer] Transfers Sit to Stand: Min guard Stand pivot transfers: Min guard       General transfer comment: pt reports dizziness upon sitting, requested to use BSC, min/guard for safety, pt relying on UEs for self assist, only able to transfer due to dizzy which did not resolve, vitals obtained upon return to bed (docflowsheets)  Ambulation/Gait             General Gait Details: pt felt unable to tolerate at this time due to lightheadedness/dizziness which did not resolve  Stairs            Wheelchair Mobility    Modified Rankin (Stroke Patients Only)       Balance                                              Pertinent Vitals/Pain Pain Assessment: No/denies pain    Home Living Family/patient expects to be discharged to:: Private residence Living Arrangements: Alone   Type of Home: House Home Access: Stairs to enter Entrance Stairs-Rails: Right Entrance Stairs-Number of Steps: 3 Home Layout: Able to live on main level with bedroom/bathroom Home Equipment: Cane - single point      Prior Function Level of Independence: Independent with assistive device(s)               Hand Dominance        Extremity/Trunk Assessment        Lower Extremity Assessment Lower Extremity Assessment: Generalized weakness       Communication   Communication: No difficulties  Cognition Arousal/Alertness: Awake/alert Behavior During Therapy: WFL for tasks assessed/performed Overall Cognitive Status: Within Functional Limits for tasks assessed                                        General Comments      Exercises     Assessment/Plan    PT Assessment Patient needs continued PT services  PT Problem List Decreased mobility;Decreased strength;Decreased balance;Decreased knowledge of use of DME;Decreased  activity tolerance       PT Treatment Interventions Gait training;DME instruction;Therapeutic activities;Therapeutic exercise;Functional mobility training;Patient/family education;Balance training    PT Goals (Current goals can be found in the Care Plan section)  Acute Rehab PT Goals PT Goal Formulation: With patient Time For Goal Achievement: 08/06/17 Potential to Achieve Goals: Good    Frequency Min 3X/week   Barriers to discharge        Co-evaluation               AM-PAC PT "6 Clicks" Daily Activity  Outcome Measure Difficulty turning over in bed (including adjusting bedclothes, sheets and blankets)?: None Difficulty moving from lying on back to sitting on the side of the bed? : A Little Difficulty sitting down  on and standing up from a chair with arms (e.g., wheelchair, bedside commode, etc,.)?: A Little Help needed moving to and from a bed to chair (including a wheelchair)?: A Little Help needed walking in hospital room?: A Lot Help needed climbing 3-5 steps with a railing? : A Lot 6 Click Score: 17    End of Session   Activity Tolerance: Other (comment) (limited by dizziness) Patient left: in bed;with call bell/phone within reach Nurse Communication: Mobility status PT Visit Diagnosis: Difficulty in walking, not elsewhere classified (R26.2)    Time: 2956-2130 PT Time Calculation (min) (ACUTE ONLY): 16 min   Charges:   PT Evaluation $PT Eval Low Complexity: 1 Low     PT G CodesCarmelia Bake, PT, DPT 07/30/2017 Pager: [phone]  York Ram E 07/30/2017, 12:24 PM

## 2017-07-30 NOTE — Evaluation (Signed)
Occupational Therapy Evaluation Patient Details Name: Tammy Hampton MRN: [id number] DOB: Nov 16, 1931 Today's Date: 07/30/2017    History of Present Illness 83 y.o. female with medical history significant for hypertension and hypothyroidism, now presenting to the emergency department for evaluation of fatigue and malaise; found to have hyponatremia   Clinical Impression   Pt admitted with  hyponatremia. Pt currently with functional limitations due to the deficits listed below (see OT Problem List).  Pt will benefit from skilled OT to increase their safety and independence with ADL and functional mobility for ADL to facilitate discharge to venue listed below.    Follow Up Recommendations  Home health OT;Supervision/Assistance - 24 hour    Equipment Recommendations  None recommended by OT       Precautions / Restrictions Precautions Precautions: Fall      Mobility Bed Mobility Overal bed mobility: Needs Assistance Bed Mobility: Supine to Sit;Sit to Supine     Supine to sit: Supervision;Min assist Sit to supine: Supervision      Transfers Overall transfer level: Needs assistance Equipment used: None Transfers: Sit to/from [employer] Sit to Stand: Min assist Stand pivot transfers: Min assist       General transfer comment: pt reports dizziness upon sitting, requested to use BSC, min/guard for safety, pt relying on UEs for self assist, only able to transfer due to dizzy which did not resolve, vitals obtained upon return to bed (docflowsheets)        ADL either performed or assessed with clinical judgement   ADL Overall ADL's : Needs assistance/impaired Eating/Feeding: Set up;Sitting   Grooming: Set up;Sitting                   Toilet Transfer: Moderate assistance;Cueing for safety;Cueing for sequencing Toilet Transfer Details (indicate cue type and reason): hand held A/ bed to chair Toileting- Clothing Manipulation and Hygiene: Moderate  assistance;Sit to/from stand;Cueing for safety;Cueing for sequencing         General ADL Comments: pt did agree to get OOB and to chair. No dizziness reported     Vision Patient Visual Report: No change from baseline              Pertinent Vitals/Pain Pain Assessment: No/denies pain        Extremity/Trunk Assessment Upper Extremity Assessment Upper Extremity Assessment: Generalized weakness   Lower Extremity Assessment Lower Extremity Assessment: Generalized weakness       Communication Communication Communication: No difficulties   Cognition Arousal/Alertness: Awake/alert Behavior During Therapy: WFL for tasks assessed/performed Overall Cognitive Status: Within Functional Limits for tasks assessed                                                Home Living Family/patient expects to be discharged to:: Private residence Living Arrangements: Alone   Type of Home: House Home Access: Stairs to enter Technical brewer of Steps: 3 Entrance Stairs-Rails: Right Home Layout: Able to live on main level with bedroom/bathroom               Home Equipment: Cane - single point          Prior Functioning/Environment Level of Independence: Independent with assistive device(s)                 OT Problem List: Decreased strength;Decreased activity tolerance;Decreased knowledge of use of DME  or AE         OT Goals(Current goals can be found in the care plan section) Acute Rehab OT Goals Patient Stated Goal: home if I can OT Goal Formulation: With patient Time For Goal Achievement: 08/13/17 Potential to Achieve Goals: Good  OT Frequency: Min 2X/week              AM-PAC PT "6 Clicks" Daily Activity     Outcome Measure Help from another person eating meals?: A Little Help from another person taking care of personal grooming?: A Little Help from another person toileting, which includes using toliet, bedpan, or urinal?: A Lot Help  from another person bathing (including washing, rinsing, drying)?: A Little Help from another person to put on and taking off regular upper body clothing?: A Little Help from another person to put on and taking off regular lower body clothing?: A Lot 6 Click Score: 16   End of Session Equipment Utilized During Treatment: Rolling walker Nurse Communication: Mobility status  Activity Tolerance: Patient tolerated treatment well Patient left: in chair;with call bell/phone within reach;with family/visitor present  OT Visit Diagnosis: Unsteadiness on feet (R26.81);Muscle weakness (generalized) (M62.81)                Time: 5027-7412 OT Time Calculation (min): 15 min Charges:  OT General Charges $OT Visit: 1 Visit OT Evaluation $OT Eval Moderate Complexity: 1 Mod G-Codes:     Kari Baars, [place]  Payton Mccallum D 07/30/2017, 3:47 PM

## 2017-07-31 DIAGNOSIS — E871 Hypo-osmolality and hyponatremia: Secondary | ICD-10-CM | POA: Diagnosis not present

## 2017-07-31 DIAGNOSIS — R05 Cough: Secondary | ICD-10-CM | POA: Diagnosis not present

## 2017-07-31 LAB — BASIC METABOLIC PANEL
ANION GAP: 7 (ref 5–15)
ANION GAP: 6 (ref 5–15)
BUN: 15 mg/dL (ref 6–20)
BUN: 17 mg/dL (ref 6–20)
CHLORIDE: 101 mmol/L (ref 101–111)
CHLORIDE: 100 mmol/L — AB (ref 101–111)
CO2: 21 mmol/L — ABNORMAL LOW (ref 22–32)
CO2: 22 mmol/L (ref 22–32)
CREATININE: 0.71 mg/dL (ref 0.44–1.00)
CREATININE: 0.68 mg/dL (ref 0.44–1.00)
Calcium: 8.3 mg/dL — ABNORMAL LOW (ref 8.9–10.3)
Calcium: 8.5 mg/dL — ABNORMAL LOW (ref 8.9–10.3)
GFR calc non Af Amer: >60 mL/min (ref >60)
GFR calc non Af Amer: >60 mL/min (ref >60)
Glucose, Bld: 144 mg/dL — ABNORMAL HIGH (ref 65–99)
Glucose, Bld: 191 mg/dL — ABNORMAL HIGH (ref 65–99)
POTASSIUM: 4.3 mmol/L (ref 3.5–5.1)
Potassium: 3.5 mmol/L (ref 3.5–5.1)
SODIUM: 129 mmol/L — AB (ref 135–145)
SODIUM: 128 mmol/L — AB (ref 135–145)

## 2017-07-31 NOTE — Progress Notes (Signed)
Occupational Therapy Treatment Patient Details Name: Tammy Hampton MRN: [id number] DOB: June 08, 1931 Today's Date: 07/31/2017    History of present illness 83 y.o. female with medical history significant for hypertension and hypothyroidism, now presenting to the emergency department for evaluation of fatigue and malaise; found to have hyponatremia      Follow Up Recommendations  Home health OT;Supervision - Intermittent    Equipment Recommendations  None recommended by OT       Precautions / Restrictions Precautions Precautions: Fall Restrictions Weight Bearing Restrictions: No       Mobility Bed Mobility               General bed mobility comments: Pt OOB in recliner   Transfers Overall transfer level: Needs assistance Equipment used: Straight cane Transfers: Sit to/from Stand;Stand Pivot Transfers Sit to Stand: Supervision Stand pivot transfers: Supervision       General transfer comment: 25% VC's on safety with turn completion and hand placement with stand to sit.  No c/o dizziness this session.         ADL either performed or assessed with clinical judgement   ADL Overall ADL's : Needs assistance/impaired         Upper Body Bathing: Set up;Sitting   Lower Body Bathing: Sit to/from stand;Set up   Upper Body Dressing : Set up;Sitting   Lower Body Dressing: Set up;Sit to/from stand   Toilet Transfer: Set up;Ambulation;Cueing for safety Toilet Transfer Details (indicate cue type and reason): cane Toileting- Clothing Manipulation and Hygiene: Sit to/from stand;Supervision/safety;Cueing for safety   Tub/ Shower Transfer: Walk-in shower;Supervision/safety     General ADL Comments: pt plans to Dc home and move to [facility] in 2 weeks     Vision Patient Visual Report: No change from baseline            Cognition Arousal/Alertness: Awake/alert Behavior During Therapy: WFL for tasks assessed/performed Overall Cognitive Status: Within Functional  Limits for tasks assessed                                                     Pertinent Vitals/ Pain       Pain Assessment: No/denies pain         Frequency  Min 2X/week        Progress Toward Goals  OT Goals(current goals can now be found in the care plan section)  Progress towards OT goals: Progressing toward goals     Plan Discharge plan remains appropriate       AM-PAC PT "6 Clicks" Daily Activity     Outcome Measure   Help from another person eating meals?: None Help from another person taking care of personal grooming?: None Help from another person toileting, which includes using toliet, bedpan, or urinal?: A Little Help from another person bathing (including washing, rinsing, drying)?: A Little Help from another person to put on and taking off regular upper body clothing?: None Help from another person to put on and taking off regular lower body clothing?: A Little 6 Click Score: 21    End of Session Equipment Utilized During Treatment: Other (comment) (cane)  OT Visit Diagnosis: Unsteadiness on feet (R26.81);Muscle weakness (generalized) (M62.81)   Activity Tolerance Patient tolerated treatment well   Patient Left in chair;with call bell/phone within reach;with family/visitor present   Nurse Communication Mobility status  Time: 7654-6503 OT Time Calculation (min): 12 min  Charges: OT General Charges $OT Visit: 1 Visit OT Treatments $Self Care/Home Management : 8-22 mins  [place], [place]   Betsy Pries 07/31/2017, 1:25 PM

## 2017-07-31 NOTE — Progress Notes (Signed)
Physical Therapy Treatment Patient Details Name: Tammy Hampton MRN: [id number] DOB: 1931/01/05 Today's Date: 07/31/2017    History of Present Illness 83 y.o. female with medical history significant for hypertension and hypothyroidism, now presenting to the emergency department for evaluation of fatigue and malaise; found to have hyponatremia    PT Comments    Assisted pt out of recliner to amb an increased distance using RW vs her cane due to c/o fatigue and mild gait unsteadiness.  Assisted to bathroom with VC's on safety with turns and using walker in tight spaces.     Follow Up Recommendations  Home health PT;Supervision for mobility/OOB     Equipment Recommendations  Rolling walker with 5" wheels    Recommendations for Other Services       Precautions / Restrictions Precautions Precautions: Fall Restrictions Weight Bearing Restrictions: No    Mobility  Bed Mobility               General bed mobility comments: Pt OOB in recliner   Transfers Overall transfer level: Needs assistance Equipment used: None Transfers: Sit to/from Stand;Stand Pivot Transfers Sit to Stand: Supervision;Min guard Stand pivot transfers: Supervision;Min guard       General transfer comment: 25% VC's on safety with turn completion and hand placement with stand to sit.  No c/o dizziness this session.   Ambulation/Gait Ambulation/Gait assistance: Supervision;Min guard Ambulation Distance (Feet): 275 Feet Assistive device: Rolling walker (2 wheeled) Gait Pattern/deviations: Step-through pattern Gait velocity: decreased   General Gait Details: amb with RW an increased distance.  No c/o dizziness.     Stairs            Wheelchair Mobility    Modified Rankin (Stroke Patients Only)       Balance                                            Cognition Arousal/Alertness: Awake/alert Behavior During Therapy: WFL for tasks assessed/performed Overall  Cognitive Status: Within Functional Limits for tasks assessed                                        Exercises      General Comments        Pertinent Vitals/Pain Pain Assessment: No/denies pain    Home Living                      Prior Function            PT Goals (current goals can now be found in the care plan section) Progress towards PT goals: Progressing toward goals    Frequency    Min 3X/week      PT Plan Current plan remains appropriate    Co-evaluation              AM-PAC PT "6 Clicks" Daily Activity  Outcome Measure  Difficulty turning over in bed (including adjusting bedclothes, sheets and blankets)?: None Difficulty moving from lying on back to sitting on the side of the bed? : A Little Difficulty sitting down on and standing up from a chair with arms (e.g., wheelchair, bedside commode, etc,.)?: A Little Help needed moving to and from a bed to chair (including a wheelchair)?: A Little Help needed walking in  hospital room?: A Lot Help needed climbing 3-5 steps with a railing? : A Lot 6 Click Score: 17    End of Session Equipment Utilized During Treatment: Gait belt Activity Tolerance: Patient tolerated treatment well Patient left:  (in bathroom with instructions to pull cord) Nurse Communication: Mobility status PT Visit Diagnosis: Difficulty in walking, not elsewhere classified (R26.2)     Time: 3838-1840 PT Time Calculation (min) (ACUTE ONLY): 24 min  Charges:  $Gait Training: 8-22 mins $Therapeutic Activity: 8-22 mins                    G Codes:       {Denea Cheaney  PTA [facility]  Acute  Rehab Pager      [phone]

## 2017-07-31 NOTE — Discharge Instructions (Signed)
Hyponatremia °Hyponatremia is when the amount of salt (sodium) in your blood is too low. When sodium levels are low, your cells absorb extra water and they swell. The swelling happens throughout the body, but it mostly affects the brain. °What are the causes? °This condition may be caused by: °· Heart, kidney, or liver problems. °· Thyroid problems. °· Adrenal gland problems. °· Metabolic conditions, such as syndrome of inappropriate antidiuretic hormone (SIADH). °· Severe vomiting and diarrhea. °· Certain medicines or illegal drugs. °· Dehydration. °· Drinking too much water. °· Eating a diet that is low in sodium. °· Large burns on your body. °· Sweating. ° °What increases the risk? °This condition is more likely to develop in people who: °· Have long-term (chronic) kidney disease. °· Have heart failure. °· Have a medical condition that causes frequent or excessive diarrhea. °· Have metabolic conditions, such as Addison disease or SIADH. °· Take certain medicines that affect the sodium and fluid balance in the blood. Some of these medicine types include: °? Diuretics. °? NSAIDs. °? Some opioid pain medicines. °? Some antidepressants. °? Some seizure prevention medicines. ° °What are the signs or symptoms? °Symptoms of this condition include: °· Nausea and vomiting. °· Confusion. °· Lethargy. °· Agitation. °· Headache. °· Seizures. °· Unconsciousness. °· Appetite loss. °· Muscle weakness and cramping. °· Feeling weak or light-headed. °· Having a rapid heart rate. °· Fainting, in severe cases. ° °How is this diagnosed? °This condition is diagnosed with a medical history and physical exam. You will also have other tests, including: °· Blood tests. °· Urine tests. ° °How is this treated? °Treatment for this condition depends on the cause. Treatment may include: °· Fluids given through an IV tube that is inserted into one of your veins. °· Medicines to correct the sodium imbalance. If medicines are causing the  condition, the medicines will need to be adjusted. °· Limiting water or fluid intake to get the correct sodium balance. ° °Follow these instructions at home: °· Take medicines only as directed by your health care provider. Many medicines can make this condition worse. Talk with your health care provider about any medicines that you are currently taking. °· Carefully follow a recommended diet as directed by your health care provider. °· Carefully follow instructions from your health care provider about fluid restrictions. °· Keep all follow-up visits as directed by your health care provider. This is important. °· Do not drink alcohol. °Contact a health care provider if: °· You develop worsening nausea, fatigue, headache, confusion, or weakness. °· Your symptoms go away and then return. °· You have problems following the recommended diet. °Get help right away if: °· You have a seizure. °· You faint. °· You have ongoing diarrhea or vomiting. °This information is not intended to replace advice given to you by your health care provider. Make sure you discuss any questions you have with your health care provider. °Document Released: 10/27/2002 Document Revised: 04/13/2016 Document Reviewed: 11/26/2014 °Elsevier Interactive Patient Education © 2018 [employer] ° °

## 2017-07-31 NOTE — Care Management Note (Signed)
Case Management Note  Patient Details  Name: Tammy Hampton MRN: [id number] Date of Birth: 26-Aug-1931  Subjective/Objective:  83 yo admitted with Hyponatremia                  Action/Plan: From home alone. Moving to [employer] in 2 weeks per pt. Pt offered choice for HHPT/HHOT and [facility] chosen. [facility] rep contacted for referral. Pt also requesting RW. Order received and [facility] rep alerted of order.  Expected Discharge Date:  07/31/17               Expected Discharge Plan:  [place]  In-House Referral:     Discharge planning Services  CM Consult  Post Acute Care Choice:  Home Health Choice offered to:  Patient  DME Arranged:  Walker rolling DME Agency:  [place] Arranged:  PT, OT [facility] Agency:  [place]  Status of Service:  Completed, signed off  If discussed at [place] of Stay Meetings, dates discussed:    Additional CommentsLynnell Catalan, RN 07/31/2017, 11:08 AM  [phone]

## 2017-07-31 NOTE — Discharge Summary (Addendum)
Physician Discharge Summary  [facility] VHQ:[id number] DOB: 19-Nov-1931 DOA: 07/29/2017  PCP: Jani Gravel, MD  Admit date: 07/29/2017 Discharge date: 07/31/2017  Admitted From: Home Disposition:  Home  Recommendations for Outpatient Follow-up:  1. Follow up with PCP in 1 week 2. Please obtain BMP in the next week to recheck sodium level   Home Health: PT OT  Equipment/Devices: None   Discharge Condition: Stable CODE STATUS: Full  Diet recommendation: Regular  Brief/Interim Summary: [facility] a 83 y.o.femalewith medical history significant for hypertension and hypothyroidism, now presenting to the emergency department for evaluation of fatigue and malaise. She reports suffering from recent URI symptoms with copious rhinorrheaand was started on a fluoroquinolone by her allergist approximately one week ago for this. Those symptoms seemed to be improving, but she has noted the insidious development of fatigue and a nonspecific malaise. In the ED, serum sodium level was low at 123. Patient was referred for further evaluation.  Discharge Diagnoses:  Principal Problem:   Hyponatremia Active Problems:   Hypertension   Generalized weakness   Hypothyroidism   Hypotonic Hyponatremia -Previous serum sodium level was 132 back in 2015 -?SIADH. Urine sodium 59, urine osmol 373, serum osmol 271  -Initially improved with IVF, then worsened with fluid restriction. Now improved again with IVF which is not consistent with SIADH -She is not on diuretic  -Improved, would recommend outpatient BMP in 1 week to ensure stability of serum sodium   Hypothyroidism -TSH normal -Continue synthroid  HTN -Continue Cozaar  Hyperlipidemia -Continue statin   Atypical chest pain -EKG obtained yesterday which showed LBBB, which had been present since 2015 as well as J-point elevation in anterior leads. Troponin negative. Chest pain now resolved this morning.   Dizziness -Orthostatic VS  negative. HHPT OT ordered.    Discharge Instructions  Discharge Instructions    Call MD for:  difficulty breathing, headache or visual disturbances    Complete by:  As directed    Call MD for:  extreme fatigue    Complete by:  As directed    Call MD for:  hives    Complete by:  As directed    Call MD for:  persistant dizziness or light-headedness    Complete by:  As directed    Call MD for:  persistant nausea and vomiting    Complete by:  As directed    Call MD for:  severe uncontrolled pain    Complete by:  As directed    Call MD for:  temperature >100.4    Complete by:  As directed    Diet general    Complete by:  As directed    Discharge instructions    Complete by:  As directed    You were cared for by a hospitalist during your hospital stay. If you have any questions about your discharge medications or the care you received while you were in the hospital after you are discharged, you can call the unit and asked to speak with the hospitalist on call if the hospitalist that took care of you is not available. Once you are discharged, your primary care physician will handle any further medical issues. Please note that NO REFILLS for any discharge medications will be authorized once you are discharged, as it is imperative that you return to your primary care physician (or establish a relationship with a primary care physician if you do not have one) for your aftercare needs so that they can reassess your need  for medications and monitor your lab values.   Increase activity slowly    Complete by:  As directed      Allergies as of 07/31/2017      Reactions   Macrobid [nitrofurantoin Monohyd Macro] Itching   Macrolides And Ketolides    Penicillins    Has patient had a PCN reaction causing immediate rash, facial/tongue/throat swelling, SOB or lightheadedness with hypotension: Unknown Has patient had a PCN reaction causing severe rash involving mucus membranes or skin necrosis:  unknown Has patient had a PCN reaction that required hospitalization: Unknown Has patient had a PCN reaction occurring within the last 10 years: unknown If all of the above answers are "NO", then may proceed with Cephalosporin use.   Sulfa Antibiotics    Zithromax [azithromycin] Itching      Medication List    TAKE these medications   amLODipine 5 MG tablet Commonly known as:  NORVASC Take 5 mg by mouth daily.   clarithromycin 500 MG tablet Commonly known as:  BIAXIN Take 500 mg by mouth 2 (two) times daily.   co-enzyme Q-10 50 MG capsule Take 100 mg by mouth daily.   dorzolamide-timolol 22.3-6.8 MG/ML ophthalmic solution Commonly known as:  COSOPT Place 1 drop into both eyes 2 (two) times daily.   Fish Oil 1000 MG Caps Take 1 capsule by mouth daily as needed (cholesterol).   latanoprost 0.005 % ophthalmic solution Commonly known as:  XALATAN Place 1 drop into both eyes at bedtime.   levothyroxine 100 MCG tablet Commonly known as:  SYNTHROID, LEVOTHROID Take 100 mcg by mouth daily before breakfast.   losartan 50 MG tablet Commonly known as:  COZAAR Take 50 mg by mouth daily.   lovastatin 20 MG tablet Commonly known as:  MEVACOR Take 20 mg by mouth at bedtime.   PROBIOTIC DAILY PO Take 1 capsule by mouth at bedtime.            Durable Medical Equipment        Start     Ordered   07/31/17 1051  For home use only DME Walker rolling  Once    Question:  Patient needs a walker to treat with the following condition  Answer:  Weakness   07/31/17 1050       Discharge Care Instructions        Start     Ordered   07/31/17 0000  Increase activity slowly     07/31/17 1026   07/31/17 0000  Discharge instructions    Comments:  You were cared for by a hospitalist during your hospital stay. If you have any questions about your discharge medications or the care you received while you were in the hospital after you are discharged, you can call the unit and asked  to speak with the hospitalist on call if the hospitalist that took care of you is not available. Once you are discharged, your primary care physician will handle any further medical issues. Please note that NO REFILLS for any discharge medications will be authorized once you are discharged, as it is imperative that you return to your primary care physician (or establish a relationship with a primary care physician if you do not have one) for your aftercare needs so that they can reassess your need for medications and monitor your lab values.   07/31/17 1026   07/31/17 0000  Diet general     07/31/17 1026   07/31/17 0000  Call MD for:  temperature >100.4  07/31/17 1026   07/31/17 0000  Call MD for:  persistant nausea and vomiting     07/31/17 1026   07/31/17 0000  Call MD for:  extreme fatigue     07/31/17 1026   07/31/17 0000  Call MD for:  persistant dizziness or light-headedness     07/31/17 1026   07/31/17 0000  Call MD for:  hives     07/31/17 1026   07/31/17 0000  Call MD for:  difficulty breathing, headache or visual disturbances     07/31/17 1026   07/31/17 0000  Call MD for:  severe uncontrolled pain     07/31/17 1026     Follow-up Information    Jani Gravel, MD. Schedule an appointment as soon as possible for a visit in 1 week(s).   Specialty:  Internal Medicine Why:  You must get a repeat BMP to check sodium level in the next week  Contact information: [place] [id number] [phone]          Allergies  Allergen Reactions  . Macrobid [Nitrofurantoin Monohyd Macro] Itching  . Macrolides And Ketolides   . Penicillins     Has patient had a PCN reaction causing immediate rash, facial/tongue/throat swelling, SOB or lightheadedness with hypotension: Unknown Has patient had a PCN reaction causing severe rash involving mucus membranes or skin necrosis: unknown Has patient had a PCN reaction that required hospitalization: Unknown Has  patient had a PCN reaction occurring within the last 10 years: unknown If all of the above answers are "NO", then may proceed with Cephalosporin use.   . Sulfa Antibiotics   . Zithromax [Azithromycin] Itching    Consultations:  None   Procedures/Studies: Dg Chest 2 View  Result Date: 07/29/2017 CLINICAL DATA:  Cough. EXAM: CHEST  2 VIEW COMPARISON:  Radiographs of July 10, 2014. FINDINGS: The heart size and mediastinal contours are within normal limits. Both lungs are clear. Atherosclerosis of thoracic aorta is noted. No pneumothorax or pleural effusion is noted. The visualized skeletal structures are unremarkable. IMPRESSION: No active cardiopulmonary disease.  Aortic atherosclerosis. Electronically Signed   By: Marijo Conception, M.D.   On: 07/29/2017 15:02      Discharge Exam: Vitals:   07/31/17 0433 07/31/17 0945  BP: 135/69 (!) 136/46  Pulse: 68 62  Resp: 18   Temp: 98.2 F (36.8 C)   SpO2: 100% 99%   Vitals:   07/30/17 1701 07/30/17 2114 07/31/17 0433 07/31/17 0945  BP: (!) 140/57 (!) 141/65 135/69 (!) 136/46  Pulse:  63 68 62  Resp: 16 17 18    Temp: 98 F (36.7 C) 98.3 F (36.8 C) 98.2 F (36.8 C)   TempSrc: Oral Oral Oral   SpO2: 100% 100% 100% 99%  Weight:      Height:        General: Pt is alert, awake, not in acute distress Cardiovascular: RRR, S1/S2 +, no rubs, no gallops Respiratory: CTA bilaterally, no wheezing, no rhonchi Abdominal: Soft, NT, ND, bowel sounds + Extremities: no edema, no cyanosis    The results of significant diagnostics from this hospitalization (including imaging, microbiology, ancillary and laboratory) are listed below for reference.     Microbiology: No results found for this or any previous visit (from the past 240 hour(s)).   Labs: BNP (last 3 results) No results for input(s): BNP in the last 8760 hours. Basic Metabolic Panel:  Recent Labs Lab 07/30/17 0417 07/30/17 1209 07/30/17 1658 07/31/17 0011 07/31/17  0845   NA 132* 128* 126* 128* 129*  K 4.6 4.2 4.3 4.3 3.5  CL 101 98* 97* 100* 101  CO2 23 23 22 22  21*  GLUCOSE 125* 119* 128* 144* 191*  BUN 19 16 17 17 15   CREATININE 0.63 0.59 0.70 0.68 0.71  CALCIUM 8.2* 8.3* 8.4* 8.5* 8.3*   Liver Function Tests:  Recent Labs Lab 07/29/17 1545  AST 22  ALT 21  ALKPHOS 56  BILITOT 0.9  PROT 6.7  ALBUMIN 3.8   No results for input(s): LIPASE, AMYLASE in the last 168 hours. No results for input(s): AMMONIA in the last 168 hours. CBC:  Recent Labs Lab 07/29/17 1500 07/30/17 0417  WBC 8.6 6.4  NEUTROABS 6.4  --   HGB 12.4 10.8*  HCT 35.0* 30.7*  MCV 90.2 88.5  PLT 270 223   Cardiac Enzymes:  Recent Labs Lab 07/30/17 0714 07/30/17 1658  TROPONINI <0.03 <0.03   BNP: Invalid input(s): POCBNP CBG: No results for input(s): GLUCAP in the last 168 hours. D-Dimer No results for input(s): DDIMER in the last 72 hours. Hgb A1c No results for input(s): HGBA1C in the last 72 hours. Lipid Profile No results for input(s): CHOL, HDL, LDLCALC, TRIG, CHOLHDL, LDLDIRECT in the last 72 hours. Thyroid function studies  Recent Labs  07/29/17 1720  TSH 0.371   Anemia work up No results for input(s): VITAMINB12, FOLATE, FERRITIN, TIBC, IRON, RETICCTPCT in the last 72 hours. Urinalysis    Component Value Date/Time   COLORURINE YELLOW 07/29/2017 1434   APPEARANCEUR CLEAR 07/29/2017 1434   LABSPEC <1.005 (L) 07/29/2017 1434   PHURINE 6.5 07/29/2017 1434   GLUCOSEU NEGATIVE 07/29/2017 1434   HGBUR NEGATIVE 07/29/2017 1434   BILIRUBINUR NEGATIVE 07/29/2017 1434   KETONESUR NEGATIVE 07/29/2017 1434   PROTEINUR NEGATIVE 07/29/2017 1434   UROBILINOGEN 0.2 07/10/2014 1531   NITRITE NEGATIVE 07/29/2017 1434   LEUKOCYTESUR NEGATIVE 07/29/2017 1434   Sepsis Labs Invalid input(s): PROCALCITONIN,  WBC,  LACTICIDVEN Microbiology No results found for this or any previous visit (from the past 240 hour(s)).   Time coordinating discharge: 40  minutes  SIGNED:  Dessa Phi, DO [facility] Pager [phone]  If 7PM-7AM, please contact night-coverage [email or web address] Password [email or web address] 07/31/2017, 1:13 PM

## 2017-07-31 NOTE — Care Management Note (Cosign Needed)
Patient ambulated aprx. 45 ft.  Blood pressure- 136/46 sitting    - 138/51 standing for 3 min. No complaints of dizziness Patient balance is steady.

## 2017-08-29 NOTE — Progress Notes (Signed)
PT Evaluation G-Codes    07/30/17 1224  PT Time Calculation  PT Start Time (ACUTE ONLY) 1017  PT Stop Time (ACUTE ONLY) 1033  PT Time Calculation (min) (ACUTE ONLY) 16 min  PT G-Codes **NOT FOR INPATIENT CLASS**  Functional Assessment Tool Used AM-PAC 6 Clicks Basic Mobility;Clinical judgement  Functional Limitation Mobility: Walking and moving around  Mobility: Walking and Moving Around Current Status (L2493) CJ  Mobility: Walking and Moving Around Goal Status (S4199) CI  PT General Charges  $$ ACUTE PT VISIT 1 Visit  PT Evaluation  $PT Eval Low Complexity 1 Low   Carmelia Bake, PT, DPT 08/29/2017 Pager: [phone]

## 2018-03-11 IMAGING — CR DG CHEST 2V
2 series · 2 of 2 positions shown · non-contrast
Comparison: Radiographs July 10, 2014.

CLINICAL DATA: Cough.

EXAM:
CHEST  2 VIEW

[w chest pa]
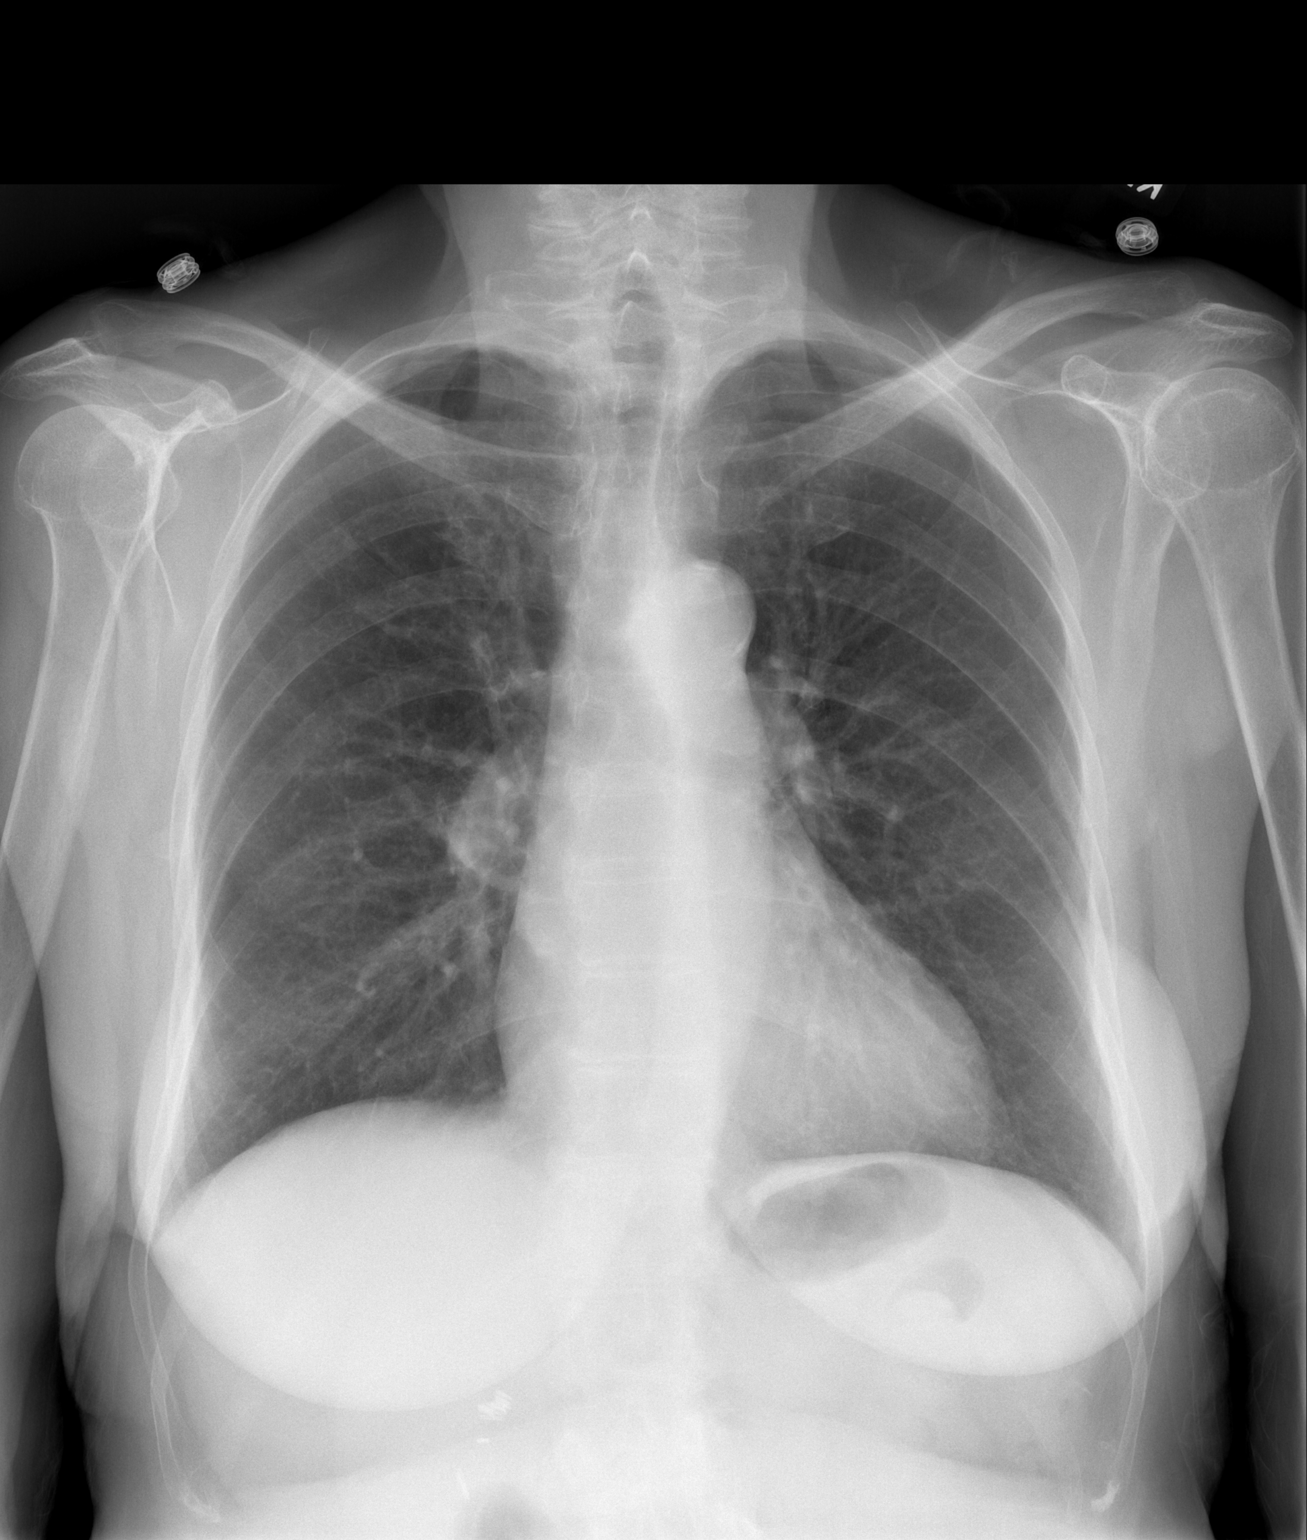

[w chest lat]
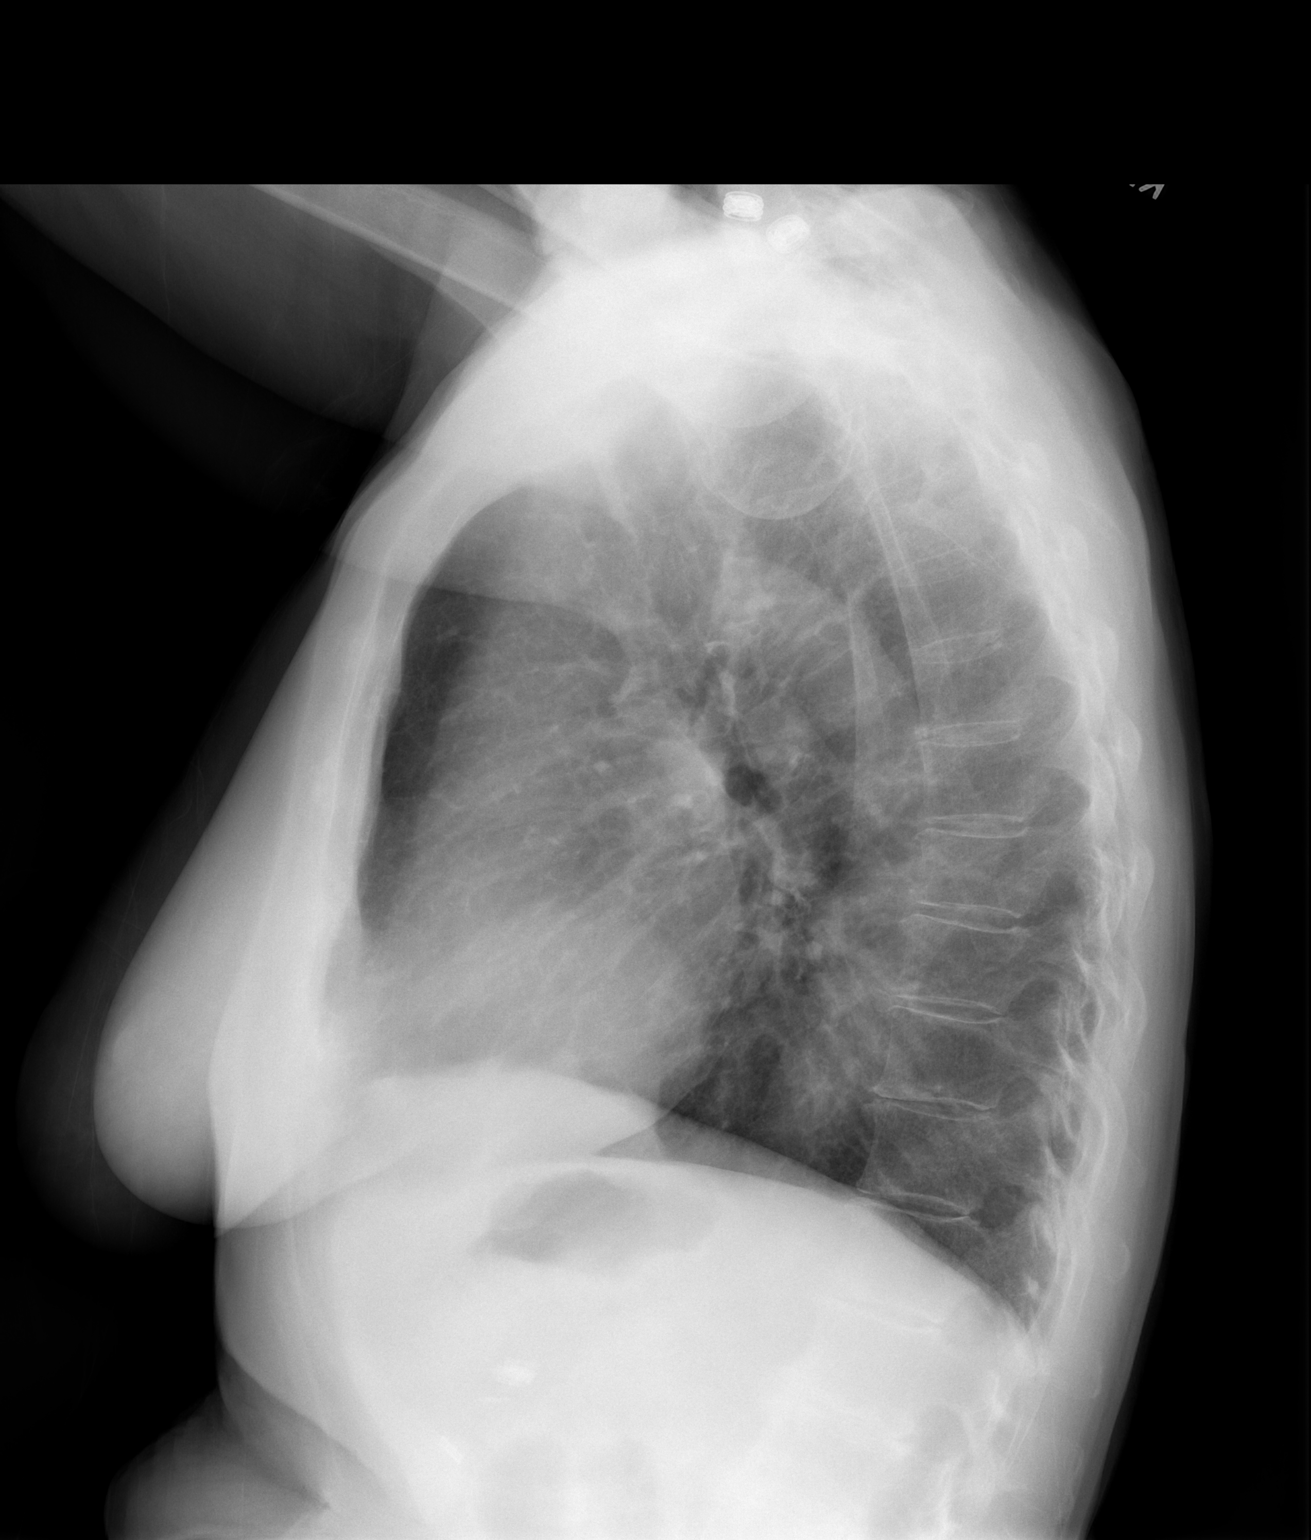

[2 of 2 positions shown; findings below may reference images not displayed]

FINDINGS: The heart size and mediastinal contours are within normal limits.
Both lungs are clear. Atherosclerosis of thoracic aorta is noted. No
pneumothorax or pleural effusion is noted. The visualized skeletal
structures are unremarkable.
IMPRESSION: No active cardiopulmonary disease.  Aortic atherosclerosis.

## 2018-04-01 ENCOUNTER — Other Ambulatory Visit: Payer: Self-pay | Admitting: Internal Medicine

## 2018-04-01 DIAGNOSIS — Z1231 Encounter for screening mammogram for malignant neoplasm of breast: Secondary | ICD-10-CM

## 2018-04-23 ENCOUNTER — Ambulatory Visit
Admission: RE | Admit: 2018-04-23 | Discharge: 2018-04-23 | Disposition: A | Discharge status: Home / Self Care | Payer: Medicare Other | Source: Ambulatory Visit | Attending: Internal Medicine | Admitting: Internal Medicine

## 2018-04-23 DIAGNOSIS — Z1231 Encounter for screening mammogram for malignant neoplasm of breast: Secondary | ICD-10-CM

## 2019-06-30 ENCOUNTER — Other Ambulatory Visit: Payer: Self-pay | Admitting: Internal Medicine

## 2019-06-30 DIAGNOSIS — Z1231 Encounter for screening mammogram for malignant neoplasm of breast: Secondary | ICD-10-CM

## 2019-07-02 ENCOUNTER — Ambulatory Visit: Payer: Medicare Other

## 2019-09-29 ENCOUNTER — Ambulatory Visit: Payer: Medicare Other

## 2021-03-14 ENCOUNTER — Other Ambulatory Visit: Payer: Self-pay | Admitting: Internal Medicine

## 2021-03-14 DIAGNOSIS — Z85850 Personal history of malignant neoplasm of thyroid: Secondary | ICD-10-CM

## 2021-03-24 ENCOUNTER — Other Ambulatory Visit: Payer: Self-pay

## 2021-03-24 ENCOUNTER — Ambulatory Visit
Admission: RE | Admit: 2021-03-24 | Discharge: 2021-03-24 | Disposition: A | Discharge status: Home / Self Care | Payer: Medicare Other | Source: Ambulatory Visit | Attending: Internal Medicine | Admitting: Internal Medicine

## 2021-03-24 DIAGNOSIS — Z85850 Personal history of malignant neoplasm of thyroid: Secondary | ICD-10-CM

## 2024-02-22 ENCOUNTER — Observation Stay (HOSPITAL_COMMUNITY)
Admission: EM | Admit: 2024-02-22 | Discharge: 2024-02-26 | Disposition: A | Discharge status: Skilled Nursing Facility | Attending: Internal Medicine | Admitting: Internal Medicine

## 2024-02-22 ENCOUNTER — Emergency Department (HOSPITAL_COMMUNITY)

## 2024-02-22 ENCOUNTER — Other Ambulatory Visit: Payer: Self-pay

## 2024-02-22 ENCOUNTER — Observation Stay (HOSPITAL_COMMUNITY)

## 2024-02-22 ENCOUNTER — Encounter (HOSPITAL_COMMUNITY): Payer: Self-pay

## 2024-02-22 DIAGNOSIS — I10 Essential (primary) hypertension: Secondary | ICD-10-CM | POA: Diagnosis not present

## 2024-02-22 DIAGNOSIS — M25562 Pain in left knee: Secondary | ICD-10-CM | POA: Diagnosis not present

## 2024-02-22 DIAGNOSIS — R0602 Shortness of breath: Secondary | ICD-10-CM | POA: Diagnosis not present

## 2024-02-22 DIAGNOSIS — E039 Hypothyroidism, unspecified: Secondary | ICD-10-CM | POA: Diagnosis not present

## 2024-02-22 DIAGNOSIS — R55 Syncope and collapse: Principal | ICD-10-CM

## 2024-02-22 DIAGNOSIS — W19XXXA Unspecified fall, initial encounter: Secondary | ICD-10-CM | POA: Diagnosis not present

## 2024-02-22 DIAGNOSIS — Z79899 Other long term (current) drug therapy: Secondary | ICD-10-CM | POA: Insufficient documentation

## 2024-02-22 DIAGNOSIS — F0390 Unspecified dementia without behavioral disturbance: Secondary | ICD-10-CM | POA: Insufficient documentation

## 2024-02-22 DIAGNOSIS — S0011XA Contusion of right eyelid and periocular area, initial encounter: Secondary | ICD-10-CM | POA: Insufficient documentation

## 2024-02-22 DIAGNOSIS — I700 Atherosclerosis of aorta: Secondary | ICD-10-CM | POA: Diagnosis not present

## 2024-02-22 DIAGNOSIS — G8929 Other chronic pain: Secondary | ICD-10-CM | POA: Diagnosis present

## 2024-02-22 DIAGNOSIS — I2510 Atherosclerotic heart disease of native coronary artery without angina pectoris: Secondary | ICD-10-CM | POA: Diagnosis not present

## 2024-02-22 DIAGNOSIS — K5900 Constipation, unspecified: Secondary | ICD-10-CM | POA: Insufficient documentation

## 2024-02-22 DIAGNOSIS — S0511XA Contusion of eyeball and orbital tissues, right eye, initial encounter: Secondary | ICD-10-CM | POA: Diagnosis present

## 2024-02-22 DIAGNOSIS — S8002XA Contusion of left knee, initial encounter: Secondary | ICD-10-CM

## 2024-02-22 DIAGNOSIS — H409 Unspecified glaucoma: Secondary | ICD-10-CM | POA: Diagnosis not present

## 2024-02-22 DIAGNOSIS — E871 Hypo-osmolality and hyponatremia: Secondary | ICD-10-CM | POA: Diagnosis present

## 2024-02-22 LAB — CBC WITH DIFFERENTIAL/PLATELET
Abs Immature Granulocytes: 0.11 10*3/uL — ABNORMAL HIGH (ref 0.00–0.07)
Basophils Absolute: 0 10*3/uL (ref 0.0–0.1)
Basophils Relative: 0 %
Eosinophils Absolute: 0 10*3/uL (ref 0.0–0.5)
Eosinophils Relative: 0 %
HCT: 33.3 % — ABNORMAL LOW (ref 36.0–46.0)
Hemoglobin: 11 g/dL — ABNORMAL LOW (ref 12.0–15.0)
Immature Granulocytes: 1 %
Lymphocytes Relative: 4 %
Lymphs Abs: 0.4 10*3/uL — ABNORMAL LOW (ref 0.7–4.0)
MCH: 32 pg (ref 26.0–34.0)
MCHC: 33 g/dL (ref 30.0–36.0)
MCV: 96.8 fL (ref 80.0–100.0)
Monocytes Absolute: 1.3 10*3/uL — ABNORMAL HIGH (ref 0.1–1.0)
Monocytes Relative: 11 %
Neutro Abs: 9.7 10*3/uL — ABNORMAL HIGH (ref 1.7–7.7)
Neutrophils Relative %: 84 %
Platelets: 209 10*3/uL (ref 150–400)
RBC: 3.44 MIL/uL — ABNORMAL LOW (ref 3.87–5.11)
RDW: 12.1 % (ref 11.5–15.5)
WBC: 11.5 10*3/uL — ABNORMAL HIGH (ref 4.0–10.5)
nRBC: 0 % (ref 0.0–0.2)

## 2024-02-22 LAB — ECHOCARDIOGRAM COMPLETE
AV Mean grad: 8 mmHg
AV Peak grad: 13.8 mmHg
Ao pk vel: 1.86 m/s
Area-P 1/2: 4.07 cm2
Calc EF: 57.5 %
Height: 62 in
S' Lateral: 2.5 cm
Single Plane A2C EF: 64.7 %
Single Plane A4C EF: 51.8 %
Weight: 1920 [oz_av]

## 2024-02-22 LAB — TROPONIN I (HIGH SENSITIVITY)
Troponin I (High Sensitivity): 12 ng/L (ref <18)
Troponin I (High Sensitivity): 7 ng/L (ref <18)

## 2024-02-22 LAB — URINALYSIS, ROUTINE W REFLEX MICROSCOPIC
Bilirubin Urine: NEGATIVE
Glucose, UA: NEGATIVE mg/dL
Ketones, ur: 5 mg/dL — AB
Leukocytes,Ua: NEGATIVE
Nitrite: POSITIVE — AB
Protein, ur: NEGATIVE mg/dL
Specific Gravity, Urine: 1.028 (ref 1.005–1.030)
pH: 5 (ref 5.0–8.0)

## 2024-02-22 LAB — COMPREHENSIVE METABOLIC PANEL WITH GFR
ALT: 16 U/L (ref 0–44)
AST: 25 U/L (ref 15–41)
Albumin: 3.9 g/dL (ref 3.5–5.0)
Alkaline Phosphatase: 65 U/L (ref 38–126)
Anion gap: 11 (ref 5–15)
BUN: 31 mg/dL — ABNORMAL HIGH (ref 8–23)
CO2: 21 mmol/L — ABNORMAL LOW (ref 22–32)
Calcium: 8.7 mg/dL — ABNORMAL LOW (ref 8.9–10.3)
Chloride: 98 mmol/L (ref 98–111)
Creatinine, Ser: 1.21 mg/dL — ABNORMAL HIGH (ref 0.44–1.00)
GFR, Estimated: 42 mL/min — ABNORMAL LOW (ref >60)
Glucose, Bld: 147 mg/dL — ABNORMAL HIGH (ref 70–99)
Potassium: 3.9 mmol/L (ref 3.5–5.1)
Sodium: 130 mmol/L — ABNORMAL LOW (ref 135–145)
Total Bilirubin: 1.6 mg/dL — ABNORMAL HIGH (ref 0.0–1.2)
Total Protein: 7.2 g/dL (ref 6.5–8.1)

## 2024-02-22 LAB — D-DIMER, QUANTITATIVE: D-Dimer, Quant: 0.52 ug{FEU}/mL — ABNORMAL HIGH (ref 0.00–0.50)

## 2024-02-22 LAB — MAGNESIUM: Magnesium: 2.4 mg/dL (ref 1.7–2.4)

## 2024-02-22 LAB — TSH: TSH: 1.563 u[IU]/mL (ref 0.350–4.500)

## 2024-02-22 LAB — BRAIN NATRIURETIC PEPTIDE: B Natriuretic Peptide: 120.5 pg/mL — ABNORMAL HIGH (ref 0.0–100.0)

## 2024-02-22 LAB — PHOSPHORUS: Phosphorus: 3.3 mg/dL (ref 2.5–4.6)

## 2024-02-22 MED ORDER — SODIUM CHLORIDE 0.9% FLUSH
3.0000 mL | Freq: Two times a day (BID) | INTRAVENOUS | Status: DC
  Administered 2024-02-22 – 2024-02-25 (×6): 3 mL via INTRAVENOUS

## 2024-02-22 MED ORDER — LEVOTHYROXINE SODIUM 100 MCG PO TABS: 100.0000 ug | ORAL_TABLET | Freq: Every day | ORAL | Status: DC

## 2024-02-22 MED ORDER — ONDANSETRON HCL 4 MG PO TABS: 4.0000 mg | ORAL_TABLET | Freq: Four times a day (QID) | ORAL | Status: DC | PRN

## 2024-02-22 MED ORDER — ACETAMINOPHEN 650 MG RE SUPP: 650.0000 mg | Freq: Four times a day (QID) | RECTAL | Status: DC | PRN

## 2024-02-22 MED ORDER — LATANOPROST 0.005 % OP SOLN
1.0000 [drp] | Freq: Every day | OPHTHALMIC | Status: DC
  Administered 2024-02-22 – 2024-02-25 (×4): 1 [drp] via OPHTHALMIC
  Filled 2024-02-22: qty 2.5

## 2024-02-22 MED ORDER — ACETAMINOPHEN 325 MG PO TABS
650.0000 mg | ORAL_TABLET | Freq: Four times a day (QID) | ORAL | Status: DC | PRN
  Administered 2024-02-22 – 2024-02-23 (×4): 650 mg via ORAL
  Filled 2024-02-22 (×4): qty 2

## 2024-02-22 MED ORDER — LEVOTHYROXINE SODIUM 50 MCG PO TABS
75.0000 ug | ORAL_TABLET | Freq: Every day | ORAL | Status: DC
  Administered 2024-02-23 – 2024-02-26 (×4): 75 ug via ORAL
  Filled 2024-02-22 (×4): qty 1

## 2024-02-22 MED ORDER — SODIUM CHLORIDE (PF) 0.9 % IJ SOLN
INTRAMUSCULAR | Status: AC
  Filled 2024-02-22: qty 50

## 2024-02-22 MED ORDER — OXYCODONE HCL 5 MG PO TABS
5.0000 mg | ORAL_TABLET | Freq: Once | ORAL | Status: AC
  Administered 2024-02-22: 5 mg via ORAL
  Filled 2024-02-22: qty 1

## 2024-02-22 MED ORDER — MELATONIN 5 MG PO TABS
5.0000 mg | ORAL_TABLET | Freq: Every evening | ORAL | Status: DC | PRN
  Administered 2024-02-23: 5 mg via ORAL
  Filled 2024-02-22: qty 1

## 2024-02-22 MED ORDER — ACETAMINOPHEN 325 MG PO TABS
650.0000 mg | ORAL_TABLET | Freq: Once | ORAL | Status: AC
  Administered 2024-02-22: 650 mg via ORAL
  Filled 2024-02-22: qty 2

## 2024-02-22 MED ORDER — IOHEXOL 350 MG/ML SOLN
60.0000 mL | Freq: Once | INTRAVENOUS | Status: AC | PRN
  Administered 2024-02-22: 60 mL via INTRAVENOUS

## 2024-02-22 MED ORDER — DORZOLAMIDE HCL-TIMOLOL MAL 2-0.5 % OP SOLN
1.0000 [drp] | Freq: Two times a day (BID) | OPHTHALMIC | Status: DC
  Administered 2024-02-22 – 2024-02-26 (×8): 1 [drp] via OPHTHALMIC
  Filled 2024-02-22: qty 10

## 2024-02-22 MED ORDER — SODIUM CHLORIDE 0.9 % IV SOLN: INTRAVENOUS | Status: AC

## 2024-02-22 MED ORDER — ONDANSETRON HCL 4 MG/2ML IJ SOLN: 4.0000 mg | Freq: Four times a day (QID) | INTRAMUSCULAR | Status: DC | PRN

## 2024-02-22 NOTE — ED Triage Notes (Signed)
 EMS [facility] happening on Thursday following dizziness and right knee giving out. Pt reports pain to left knee, back, and right eye. Bruising to right eye. Swelling to left knee. (-) LOC, (-) thinners. Pt reports SOB earlier today. No acute respiratory distress noted.

## 2024-02-22 NOTE — Progress Notes (Signed)
 Per phamrcy, synthroid reduced to 75 mcg daily to match home dose.

## 2024-02-22 NOTE — H&P (Signed)
 History and Physical    Patient: Tammy Hampton:[id number] DOB: 1931-11-10 DOA: 02/22/2024 DOS: the patient was seen and examined on 02/22/2024 PCP: Pearson Grippe, MD  Patient coming from: ALF/ILF  Chief Complaint:  Chief Complaint  Patient presents with   Fall   HPI: Tammy Hampton is a 89 y.o. female with medical history significant of glaucoma, hypertension, hypothyroidism, hyponatremia, diverticulosis, constipation, GERD, history of rectal bleeding who presented to the emergency department from her independent living facility due to having a fall several minutes after going having a bowel movement.  The patient stated that after she got out of the bathroom she went to the kitchen to get some water.  She tried to going 1 direction in, felt like her left knee buckled and fell, but she is not able to tell me all the details.  She denied loss of consciousness.  She has pain in the right periorbital area, but no nausea or emesis.  She stated that she has palpitation recently.  No fever, chills or night sweats. No recent sore throat, rhinorrhea, dyspnea, wheezing or hemoptysis.  No chest pain, diaphoresis, PND, orthopnea or pitting edema of the lower extremities.  No appetite changes, abdominal pain, diarrhea, constipation, melena or hematochezia.  No flank pain, dysuria, frequency or hematuria.  No polyuria, polydipsia, polyphagia or blurred vision.  Lab work: CBC showed white count 11.5, hemoglobin 11.0 g/dL platelets 811.  D-dimer was 0.52.  First troponin level was normal, second measurement is pending and BNP 120.5 pg/mL.  CMP shows iron 30, potassium 3.9, chloride 98 and CO2 21 mmol/L.  Glucose 147, BUN 31, creatinine 1.21, corrected calcium 8.8 and total bilirubin 1.6 mg/dL.  The rest of the LFTs were normal.  Imaging: Portable 1 view chest radiograph showing low volume filling with vascular congestion and possible interstitial edema.  There is cardiomegaly as well.  No pneumothorax or  pleural effusion.  Bones are diffusely demineralized.  Left knee x-ray showing marked arthritis, multiple mineralized intra-articular loose bodies, but no acute findings.  CT head without contrast no acute intracranial CT findings.  There are calcifications along the cortical surface that could be due to residual from a remote interval subarachnoid bleed or infection.   ED course: Initial vital signs were temperature 98.8 F, pulse 72, respirations 16, BP 153/68 mmHg O2 sat 98% on room air.  The patient received acetaminophen 650 mg p.o. x 1.  Review of Systems: As mentioned in the history of present illness. All other systems reviewed and are negative.  Past Medical History:  Diagnosis Date   Glaucoma    Hypertension    Mitral valve prolapse    Past Surgical History:  Procedure Laterality Date   ABDOMINAL HYSTERECTOMY     APPENDECTOMY     BREAST EXCISIONAL BIOPSY     left 1990 right 1964   CHOLECYSTECTOMY     THYROIDECTOMY  1967   Social History:  reports that she has never smoked. She has never used smokeless tobacco. She reports that she does not drink alcohol and does not use drugs.  Allergies  Allergen Reactions   Macrobid [Nitrofurantoin Monohyd Macro] Itching   Macrolides And Ketolides    Penicillins     Has patient had a PCN reaction causing immediate rash, facial/tongue/throat swelling, SOB or lightheadedness with hypotension: Unknown Has patient had a PCN reaction causing severe rash involving mucus membranes or skin necrosis: unknown Has patient had a PCN reaction that required hospitalization: Unknown Has patient had a  PCN reaction occurring within the last 10 years: unknown If all of the above answers are "NO", then may proceed with Cephalosporin use.    Sulfa Antibiotics    Zithromax [Azithromycin] Itching    History reviewed. No pertinent family history.  Prior to Admission medications   Medication Sig Start Date End Date Taking? Authorizing Provider   levothyroxine (SYNTHROID, LEVOTHROID) 100 MCG tablet Take 100 mcg by mouth daily before breakfast.   Yes [provider]  amLODipine (NORVASC) 5 MG tablet Take 5 mg by mouth daily.    [provider]  clarithromycin (BIAXIN) 500 MG tablet Take 500 mg by mouth 2 (two) times daily.    [provider]  co-enzyme Q-10 50 MG capsule Take 100 mg by mouth daily.    [provider]  dorzolamide-timolol (COSOPT) 22.3-6.8 MG/ML ophthalmic solution Place 1 drop into both eyes 2 (two) times daily.    [provider]  latanoprost (XALATAN) 0.005 % ophthalmic solution Place 1 drop into both eyes at bedtime.    [provider]  losartan (COZAAR) 50 MG tablet Take 50 mg by mouth daily.    [provider]  lovastatin (MEVACOR) 20 MG tablet Take 20 mg by mouth at bedtime.    [provider]  Omega-3 Fatty Acids (FISH OIL) 1000 MG CAPS Take 1 capsule by mouth daily as needed (cholesterol).     [provider]  Probiotic Product (PROBIOTIC DAILY PO) Take 1 capsule by mouth at bedtime.    [provider]    Physical Exam: Vitals:   02/22/24 0457 02/22/24 0458 02/22/24 0530  BP: (!) 153/68  (!) 127/51  Pulse: 72  65  Resp: 16  20  Temp: 98.8 F (37.1 C)    TempSrc: Oral    SpO2: 98%  97%  Weight:  54.4 kg   Height:  5\' 2"  (1.575 m)    Physical Exam Vitals and nursing note reviewed.  Constitutional:      Appearance: She is ill-appearing.  HENT:     Head: Normocephalic.     Nose: No rhinorrhea.     Mouth/Throat:     Mouth: Mucous membranes are dry.  Eyes:     General: No scleral icterus.    Conjunctiva/sclera:     Right eye: Right conjunctiva is injected.     Pupils: Pupils are equal, round, and reactive to light.     Comments: Right eye periorbital ecchymosis.  Please see picture below.  Neck:     Vascular: No JVD.  Cardiovascular:     Rate and Rhythm: Normal rate and regular rhythm.  Pulmonary:      Effort: Pulmonary effort is normal.     Breath sounds: Normal breath sounds. No wheezing, rhonchi or rales.  Abdominal:     General: Bowel sounds are normal. There is no distension.     Palpations: Abdomen is soft.     Tenderness: There is no abdominal tenderness.  Musculoskeletal:     Cervical back: Neck supple.     Right lower leg: No edema.     Left lower leg: No edema.  Skin:    General: Skin is warm and dry.     Coloration: Skin is not jaundiced.  Neurological:     General: No focal deficit present.     Mental Status: She is alert and oriented to person, place, and time.  Psychiatric:        Mood and Affect: Mood normal.  Behavior: Behavior normal.        Data Reviewed:  Results are pending, will review when available.  EKG: Vent. rate 71 BPM PR interval 57 ms QRS duration 160 ms QT/QTcB 469/510 ms P-R-T axes -18 -19 103 Sinus rhythm Atrial premature complex Short PR interval Left bundle branch block  Assessment and Plan: Principal Problem:   Fall    Near syncope Observation/telemetry. No IVF. -Has cardiomegaly/pulmonary vascular congestion Hold antihypertensives today. Correct electrolyte abnormality. Check carotid Doppler. Check transthoracic echocardiogram.  Active Problems:   Hyponatremia CHF? No GI losses per patient. Follow sodium level in AM.    Hypertension Holding antihypertensives today.    Aortic atherosclerosis (HCC)   Coronary atherosclerosis No longer taking lovastatin. Follow-up with primary care provider.    Hypothyroidism Continue levothyroxine 100 mcg p.o. daily. Add on TSH earlier labs.    Periorbital contusion of right eye Analgesics as needed. SCDs for DVT prophylaxis.    Chronic pain of left knee Physical therapy consulted.    Glaucoma Continue Cosopt and Xalatan eyedrops. Follow-up with ophthalmology as an outpatient.      Advance Care Planning:   Code Status: Full Code   Consults:   Family  Communication:   Severity of Illness: The appropriate patient status for this patient is OBSERVATION. Observation status is judged to be reasonable and necessary in order to provide the required intensity of service to ensure the patient's safety. The patient's presenting symptoms, physical exam findings, and initial radiographic and laboratory data in the context of their medical condition is felt to place them at decreased risk for further clinical deterioration. Furthermore, it is anticipated that the patient will be medically stable for discharge from the hospital within 2 midnights of admission.   Author: Bobette Mo, MD 02/22/2024 8:58 AM  For on call review [email or web address].   This document was prepared using Dragon voice recognition software and may contain some unintended transcription errors.

## 2024-02-22 NOTE — Progress Notes (Signed)
  Echocardiogram 2D Echocardiogram has been performed.  Rosemary Holms, RDCS 02/22/2024, 3:30 PM

## 2024-02-22 NOTE — ED Provider Notes (Signed)
 [facility] EMERGENCY DEPARTMENT AT [facility] Provider Note   CSN: [id number] Arrival date & time: 02/22/24  0446     History  Chief Complaint  Patient presents with   Tammy Hampton    Tammy Hampton is a 89 y.o. female.  The history is provided by the patient, the EMS personnel and medical records.  Fall  Tammy Hampton is a 89 y.o. female who presents to the Emergency Department complaining of fall.  She presents to the emergency department by EMS for evaluation following fall.  She states that yesterday she became dizzy and went to get a drink of water and she fell, striking her face.  She is unsure if she lost consciousness.  She was able to get herself up.  She has been experiencing continuing dizziness and difficulty ambulating, which prompted EMS call.  She has chronic pain in her left leg and knee, worse since the fall.  She also reports shortness of breath associated with her dizziness starting today.  She has a history of hypertension, thyroid disease.  No history of DVT/PE.  No recent illnesses.     Home Medications Prior to Admission medications   Medication Sig Start Date End Date Taking? Authorizing Provider  levothyroxine (SYNTHROID, LEVOTHROID) 100 MCG tablet Take 100 mcg by mouth daily before breakfast.   Yes [provider]  amLODipine (NORVASC) 5 MG tablet Take 5 mg by mouth daily.    [provider]  clarithromycin (BIAXIN) 500 MG tablet Take 500 mg by mouth 2 (two) times daily.    [provider]  co-enzyme Q-10 50 MG capsule Take 100 mg by mouth daily.    [provider]  dorzolamide-timolol (COSOPT) 22.3-6.8 MG/ML ophthalmic solution Place 1 drop into both eyes 2 (two) times daily.    [provider]  latanoprost (XALATAN) 0.005 % ophthalmic solution Place 1 drop into both eyes at bedtime.    [provider]  losartan (COZAAR) 50 MG tablet Take 50 mg by mouth daily.    [provider]   lovastatin (MEVACOR) 20 MG tablet Take 20 mg by mouth at bedtime.    [provider]  Omega-3 Fatty Acids (FISH OIL) 1000 MG CAPS Take 1 capsule by mouth daily as needed (cholesterol).     [provider]  Probiotic Product (PROBIOTIC DAILY PO) Take 1 capsule by mouth at bedtime.    [provider]      Allergies    Macrobid [nitrofurantoin monohyd macro], Macrolides and ketolides, Penicillins, Sulfa antibiotics, and Zithromax [azithromycin]    Review of Systems   Review of Systems  All other systems reviewed and are negative.   Physical Exam Updated Vital Signs BP (!) 127/51   Pulse 65   Temp 98.8 F (37.1 C) (Oral)   Resp 20   Ht 5\' 2"  (1.575 m)   Wt 54.4 kg   SpO2 97%   BMI 21.95 kg/m  Physical Exam Vitals and nursing note reviewed.  Constitutional:      Appearance: She is well-developed.  HENT:     Head: Normocephalic.     Comments: Ecchymosis to right temple and right periorbital region.  Pupils equal round and reactive, EOMI. Cardiovascular:     Rate and Rhythm: Normal rate and regular rhythm.  Pulmonary:     Effort: Pulmonary effort is normal. No respiratory distress.  Abdominal:     Palpations: Abdomen is soft.     Tenderness: There is no abdominal tenderness. There  is no guarding or rebound.  Musculoskeletal:     Comments: No tenderness over the hips bilaterally.  There is soft tissue swelling and tenderness over the left knee.  Trace nonpitting edema to the left lower extremity.  Skin:    General: Skin is warm and dry.  Neurological:     Mental Status: She is alert and oriented to person, place, and time.     Comments: No asymmetry of facial movements.  5 out of 5 strength in bilateral upper extremities.  5 out of 5 strength in the right lower extremity.  4 out of 5 strength in the left lower extremity, although pain does limit her strength testing.  Sensation to light touch intact in all 4 extremities.  Psychiatric:         Behavior: Behavior normal.     ED Results / Procedures / Treatments   Labs (all labs ordered are listed, but only abnormal results are displayed) Labs Reviewed  COMPREHENSIVE METABOLIC PANEL WITH GFR - Abnormal; Notable for the following components:      Result Value   Sodium 130 (*)    CO2 21 (*)    Glucose, Bld 147 (*)    BUN 31 (*)    Creatinine, Ser 1.21 (*)    Calcium 8.7 (*)    Total Bilirubin 1.6 (*)    GFR, Estimated 42 (*)    All other components within normal limits  CBC WITH DIFFERENTIAL/PLATELET - Abnormal; Notable for the following components:   WBC 11.5 (*)    RBC 3.44 (*)    Hemoglobin 11.0 (*)    HCT 33.3 (*)    Neutro Abs 9.7 (*)    Lymphs Abs 0.4 (*)    Monocytes Absolute 1.3 (*)    Abs Immature Granulocytes 0.11 (*)    All other components within normal limits  D-DIMER, QUANTITATIVE - Abnormal; Notable for the following components:   D-Dimer, Quant 0.52 (*)    All other components within normal limits  BRAIN NATRIURETIC PEPTIDE - Abnormal; Notable for the following components:   B Natriuretic Peptide 120.5 (*)    All other components within normal limits  URINALYSIS, ROUTINE W REFLEX MICROSCOPIC  TROPONIN I (HIGH SENSITIVITY)    EKG EKG Interpretation Date/Time:  Friday February 22 2024 05:10:03 EDT Ventricular Rate:  71 PR Interval:  57 QRS Duration:  160 QT Interval:  469 QTC Calculation: 510 R Axis:   -19  Text Interpretation: Sinus rhythm Atrial premature complex Short PR interval Left bundle branch block Confirmed by Tilden Fossa [phone]) on 02/22/2024 5:12:33 AM  Radiology CT Angio Chest PE W/Cm &/Or Wo Cm Result Date: 02/22/2024 CLINICAL DATA:  Positive D-dimer, shortness of breath, dizziness. Pulmonary embolism suspected. EXAM: CT ANGIOGRAPHY CHEST WITH CONTRAST TECHNIQUE: Multidetector CT imaging of the chest was performed using the standard protocol during bolus administration of intravenous contrast. Multiplanar CT image reconstructions  and MIPs were obtained to evaluate the vascular anatomy. RADIATION DOSE REDUCTION: This exam was performed according to the departmental dose-optimization program which includes automated exposure control, adjustment of the mA and/or kV according to patient size and/or use of iterative reconstruction technique. CONTRAST:  60mL OMNIPAQUE IOHEXOL 350 MG/ML SOLN COMPARISON:  Most recent portable chest was today, before that PA and lateral chest 07/29/2017. Comparison is made with chest, abdomen and pelvis CT with contrast 03/12/2013. FINDINGS: Cardiovascular: There is an enlarged pulmonary trunk, 3.2 cm indicating arterial hypertension, was previously 2.7 cm. There is good arterial opacification throughout and  no evidence of arterial embolus. There are patchy calcific plaques along the aorta and scattered calcification in the great vessels without aneurysm, stenosis, or dissection. There are few small foci of air in the right internal mammary vein, probably either injected with the contrast or at the time of IV insertion. No air is seen in the pulmonary arteries. There is mild-to-moderate cardiomegaly with a left chamber predominance. There is a small amount of scattered left main and 2 vessel coronary calcific plaque in the right coronary artery and LAD. The pulmonary veins are prominent superiorly. No pericardial effusion. Mediastinum/Nodes: Again seen is a very small right lobe of the thyroid, absence of the left lobe. No thyroid nodule or axillary mass. There is no intrathoracic adenopathy. The trachea and both of the main bronchi are clear. Thoracic esophagus is unremarkable.  There is a small hiatal hernia. Lungs/Pleura: Posteriorly there are hazy opacities in the lower lobes consistent with atelectasis. There is diffuse bronchial thickening. There is no consolidation, effusion or nodule. There is no pneumothorax. There is biapical scarring change. Upper Abdomen: No acute abnormality. Status post cholecystectomy.  There is a 2.4 cm periampullary duodenal diverticulum. There is abdominal aortic atherosclerosis. Musculoskeletal: Osteopenia and degenerative change thoracic spine. No acute or significant osseous findings. Unremarkable chest wall. Review of the MIP images confirms the above findings. IMPRESSION: 1. No evidence of arterial embolus. Enlarged pulmonary trunk indicating arterial hypertension. 2. Cardiomegaly with left chamber predominance. 3. Aortic and coronary artery atherosclerosis. 4. Prominent superior pulmonary veins.  No visible edema. 5. Bronchitis.  No focal infiltrate. 6. Small hiatal hernia. 7. 2.4 cm periampullary duodenal diverticulum. 8. Osteopenia and degenerative change. Electronically Signed   By: Almira Bar M.D.   On: 02/22/2024 07:02   CT Head Wo Contrast Result Date: 02/22/2024 CLINICAL DATA:  Dizziness and fall injury with right facial impact and neck pain. EXAM: CT HEAD WITHOUT CONTRAST CT CERVICAL SPINE WITHOUT CONTRAST TECHNIQUE: Multidetector CT imaging of the head and cervical spine was performed following the standard protocol without intravenous contrast. Multiplanar CT image reconstructions of the cervical spine were also generated. RADIATION DOSE REDUCTION: This exam was performed according to the departmental dose-optimization program which includes automated exposure control, adjustment of the mA and/or kV according to patient size and/or use of iterative reconstruction technique. COMPARISON:  Most recent study is MRI brain 07/21/2014. No prior neck or cervical spine CT. FINDINGS: CT HEAD FINDINGS Brain: There is mild global atrophy with atrophic ventriculomegaly and mild-to-moderate small vessel disease of the cerebral white matter. No cortical based infarct, hemorrhage, mass effect or midline shift are seen. There are calcifications along the cortical surface in the parasagittal high right frontal lobe on series 3 axial 24 and 25. These do not appear to have been present  previously. This could be due to residua from a remote interval subarachnoid bleed or infection. Vascular: Moderate patchy calcific plaque both siphons. No hyperdense central vessel is seen. Skull: Negative for fractures or focal lesions. Swelling extends mildly over the right brow and right cheek with no other focal scalp abnormality. Sinuses/Orbits: Old lens replacements with no new orbital abnormality. Visualized sinuses and mastoid air cells are clear. Other: None. CT CERVICAL SPINE FINDINGS Alignment: Normal. Skull base and vertebrae: No acute fracture is evident. No primary bone lesion or focal pathologic process. There is osteopenia. Soft tissues and spinal canal: No prevertebral fluid or swelling. No visible canal hematoma. There are mild calcifications at the carotid bifurcations. No laryngeal or thyroid mass. Disc  levels: There is degenerative disc space loss, endplate Schmorl's nodes and bidirectional endplate spurring at C5-6 and C6-7. The other discs are normal heights. No herniated discs or cord compromise are seen. There is multilevel mild facet joint spurring, along with uncinate hypertrophy causing moderate left greater than right foraminal stenosis at C5-6. The other foramina are clear. Upper chest: Biapical scarring changes. Scattered calcific plaque in the great vessels. Other: There is bone-on-bone joint space loss and remodeling of both TMJs. IMPRESSION: 1. No acute intracranial CT findings or depressed skull fractures. 2. Atrophy and small-vessel disease. 3. Calcifications along the cortical surface in the parasagittal high right frontal lobe which do not appear to have been present on 2015 MRI. This could be due to residua from a remote interval subarachnoid bleed or infection. 4. Osteopenia and degenerative change without evidence of cervical fractures. 5. Great vessel and carotid atherosclerosis. 6. Bilateral TMJ arthropathy. Electronically Signed   By: Almira Bar M.D.   On: 02/22/2024  06:43   CT Cervical Spine Wo Contrast Result Date: 02/22/2024 CLINICAL DATA:  Dizziness and fall injury with right facial impact and neck pain. EXAM: CT HEAD WITHOUT CONTRAST CT CERVICAL SPINE WITHOUT CONTRAST TECHNIQUE: Multidetector CT imaging of the head and cervical spine was performed following the standard protocol without intravenous contrast. Multiplanar CT image reconstructions of the cervical spine were also generated. RADIATION DOSE REDUCTION: This exam was performed according to the departmental dose-optimization program which includes automated exposure control, adjustment of the mA and/or kV according to patient size and/or use of iterative reconstruction technique. COMPARISON:  Most recent study is MRI brain 07/21/2014. No prior neck or cervical spine CT. FINDINGS: CT HEAD FINDINGS Brain: There is mild global atrophy with atrophic ventriculomegaly and mild-to-moderate small vessel disease of the cerebral white matter. No cortical based infarct, hemorrhage, mass effect or midline shift are seen. There are calcifications along the cortical surface in the parasagittal high right frontal lobe on series 3 axial 24 and 25. These do not appear to have been present previously. This could be due to residua from a remote interval subarachnoid bleed or infection. Vascular: Moderate patchy calcific plaque both siphons. No hyperdense central vessel is seen. Skull: Negative for fractures or focal lesions. Swelling extends mildly over the right brow and right cheek with no other focal scalp abnormality. Sinuses/Orbits: Old lens replacements with no new orbital abnormality. Visualized sinuses and mastoid air cells are clear. Other: None. CT CERVICAL SPINE FINDINGS Alignment: Normal. Skull base and vertebrae: No acute fracture is evident. No primary bone lesion or focal pathologic process. There is osteopenia. Soft tissues and spinal canal: No prevertebral fluid or swelling. No visible canal hematoma. There are mild  calcifications at the carotid bifurcations. No laryngeal or thyroid mass. Disc levels: There is degenerative disc space loss, endplate Schmorl's nodes and bidirectional endplate spurring at C5-6 and C6-7. The other discs are normal heights. No herniated discs or cord compromise are seen. There is multilevel mild facet joint spurring, along with uncinate hypertrophy causing moderate left greater than right foraminal stenosis at C5-6. The other foramina are clear. Upper chest: Biapical scarring changes. Scattered calcific plaque in the great vessels. Other: There is bone-on-bone joint space loss and remodeling of both TMJs. IMPRESSION: 1. No acute intracranial CT findings or depressed skull fractures. 2. Atrophy and small-vessel disease. 3. Calcifications along the cortical surface in the parasagittal high right frontal lobe which do not appear to have been present on 2015 MRI. This could be due  to residua from a remote interval subarachnoid bleed or infection. 4. Osteopenia and degenerative change without evidence of cervical fractures. 5. Great vessel and carotid atherosclerosis. 6. Bilateral TMJ arthropathy. Electronically Signed   By: Almira Bar M.D.   On: 02/22/2024 06:43   DG Chest Port 1 View Result Date: 02/22/2024 CLINICAL DATA:  Fall with shortness of breath. EXAM: PORTABLE CHEST 1 VIEW COMPARISON:  07/29/2017 FINDINGS: Low volume film. The cardio pericardial silhouette is enlarged. Vascular congestion noted. Diffuse interstitial prominence may be chronic although a component of interstitial edema is not excluded. No pneumothorax or pleural effusion. Bones are diffusely demineralized. Telemetry leads overlie the chest. IMPRESSION: Low volume film with vascular congestion and possible interstitial edema. Electronically Signed   By: Kennith Center M.D.   On: 02/22/2024 05:41   DG Knee Complete 4 Views Left Result Date: 02/22/2024 CLINICAL DATA:  Fall.  Pain and swelling to the left knee. EXAM: LEFT KNEE  - COMPLETE 4+ VIEW COMPARISON:  None Available. FINDINGS: No evidence for an acute fracture. No subluxation or dislocation. Marked degenerative changes are noted in all 3 compartments with complete loss of joint space in the medial compartment. Marked hypertrophic spurring is seen in the medial and patellofemoral compartments with more modest spurring in the lateral compartment. There is calcification of the lateral meniscus. Multiple mineralized intra-articular loose bodies are seen within the joint space anteriorly and posteriorly with evidence of joint effusion. IMPRESSION: 1. No acute bony findings. 2. Marked degenerative changes in all 3 compartments with complete loss of joint space in the medial compartment. 3. Multiple mineralized intra-articular loose bodies. Electronically Signed   By: Kennith Center M.D.   On: 02/22/2024 05:34    Procedures Procedures    Medications Ordered in ED Medications  acetaminophen (TYLENOL) tablet 650 mg (has no administration in time range)  iohexol (OMNIPAQUE) 350 MG/ML injection 60 mL (60 mLs Intravenous Contrast Given 02/22/24 0608)    ED Course/ Medical Decision Making/ A&P                                 Medical Decision Making Amount and/or Complexity of Data Reviewed Labs: ordered. Radiology: ordered.  Risk OTC drugs. Prescription drug management. Decision regarding hospitalization.   Patient with history of hypertension, thyroid disease here for evaluation following a near syncopal event with fall resulting in head contusion.  CT head, cervical spine is negative for acute abnormality.  She does report associated shortness of breath.  Chest x-ray with possible pulmonary vascular congestion.  D-dimer was elevated.  A CTA was obtained, which is negative for PE BMP with stable hyponatremia, does have mild elevation in BUN and creatinine-no recent labs are available for comparison.  Plain film of the knee is negative for acute fracture.  Patient  currently lives in independent living, ambulates with a walker.  Concern for recurrent near syncopal episodes resulting in fall, now with worsening knee pain.  Plan to admit for observation given near syncope.  Hospitalist consulted for admission.        Final Clinical Impression(s) / ED Diagnoses Final diagnoses:  Fall, initial encounter  Near syncope  Contusion of left knee, initial encounter    Rx / DC Orders ED Discharge Orders     None         Tilden Fossa, MD 02/22/24 0730

## 2024-02-23 DIAGNOSIS — R55 Syncope and collapse: Secondary | ICD-10-CM | POA: Diagnosis not present

## 2024-02-23 LAB — COMPREHENSIVE METABOLIC PANEL WITH GFR
ALT: 15 U/L (ref 0–44)
AST: 20 U/L (ref 15–41)
Albumin: 3.3 g/dL — ABNORMAL LOW (ref 3.5–5.0)
Alkaline Phosphatase: 55 U/L (ref 38–126)
Anion gap: 12 (ref 5–15)
BUN: 30 mg/dL — ABNORMAL HIGH (ref 8–23)
CO2: 19 mmol/L — ABNORMAL LOW (ref 22–32)
Calcium: 8.2 mg/dL — ABNORMAL LOW (ref 8.9–10.3)
Chloride: 99 mmol/L (ref 98–111)
Creatinine, Ser: 1.09 mg/dL — ABNORMAL HIGH (ref 0.44–1.00)
GFR, Estimated: 48 mL/min — ABNORMAL LOW (ref >60)
Glucose, Bld: 96 mg/dL (ref 70–99)
Potassium: 3.5 mmol/L (ref 3.5–5.1)
Sodium: 130 mmol/L — ABNORMAL LOW (ref 135–145)
Total Bilirubin: 1.1 mg/dL (ref 0.0–1.2)
Total Protein: 6.3 g/dL — ABNORMAL LOW (ref 6.5–8.1)

## 2024-02-23 LAB — URINALYSIS, W/ REFLEX TO CULTURE (INFECTION SUSPECTED)
Bilirubin Urine: NEGATIVE
Glucose, UA: NEGATIVE mg/dL
Ketones, ur: 5 mg/dL — AB
Leukocytes,Ua: NEGATIVE
Nitrite: NEGATIVE
Protein, ur: NEGATIVE mg/dL
Specific Gravity, Urine: 1.025 (ref 1.005–1.030)
pH: 5 (ref 5.0–8.0)

## 2024-02-23 LAB — CBC
HCT: 31 % — ABNORMAL LOW (ref 36.0–46.0)
Hemoglobin: 10.1 g/dL — ABNORMAL LOW (ref 12.0–15.0)
MCH: 32.5 pg (ref 26.0–34.0)
MCHC: 32.6 g/dL (ref 30.0–36.0)
MCV: 99.7 fL (ref 80.0–100.0)
Platelets: 178 10*3/uL (ref 150–400)
RBC: 3.11 MIL/uL — ABNORMAL LOW (ref 3.87–5.11)
RDW: 12.1 % (ref 11.5–15.5)
WBC: 11.1 10*3/uL — ABNORMAL HIGH (ref 4.0–10.5)
nRBC: 0 % (ref 0.0–0.2)

## 2024-02-23 LAB — GLUCOSE, CAPILLARY: Glucose-Capillary: 109 mg/dL — ABNORMAL HIGH (ref 70–99)

## 2024-02-23 NOTE — Care Management Obs Status (Signed)
 MEDICARE OBSERVATION STATUS NOTIFICATION   Patient Details  Name: Tammy Hampton MRN: [id number] Date of Birth: 05/23/1931   Medicare Observation Status Notification Given:  Yes    Lewie Chamber, LCSW 02/23/2024, 11:21 AM

## 2024-02-23 NOTE — TOC Initial Note (Signed)
 Transition of Care [facility]) - Initial/Assessment Note   Patient Details  Name: Tammy Hampton MRN: [id number] Date of Birth: 06/14/1931  [facility] CM/SW Contact:    Ewing Schlein, LCSW Phone Number: 02/23/2024, 12:37 PM  Clinical Narrative: Patient resides in independent living at [facility]. PT consulted. TOC awaiting recommendations.  Expected Discharge Plan: Home/Self Care ([facility] IL[facility]) Barriers to Discharge: Continued Medical Work up  Expected Discharge Plan and Services In-house Referral: Clinical Social Work Living arrangements for the past 2 months: Independent Living Facility  Prior Living Arrangements/Services Living arrangements for the past 2 months: Independent Living Facility Patient language and need for interpreter reviewed:: Yes Do you feel safe going back to the place where you live?: Yes      Need for Family Participation in Patient Care: Yes (Comment) (Patient is currently oriented x2.) Care giver support system in place?: Yes (comment) Criminal Activity/Legal Involvement Pertinent to Current Situation/Hospitalization: No - Comment as needed  Activities of Daily Living ADL Screening (condition at time of admission) Independently performs ADLs?: Yes (appropriate for developmental age) Is the patient deaf or have difficulty hearing?: No Does the patient have difficulty seeing, even when wearing glasses/contacts?: No Does the patient have difficulty concentrating, remembering, or making decisions?: No  Emotional Assessment Orientation: : Oriented to Self, Oriented to Place Alcohol / Substance Use: Not Applicable Psych Involvement: No (comment)  Admission diagnosis:  Near syncope [R55] Contusion of left knee, initial encounter [S80.02XA] Fall, initial encounter [W19.XXXA] Patient Active Problem List   Diagnosis Date Noted   Near syncope 02/22/2024   Constipation 02/22/2024   Periorbital contusion of right eye 02/22/2024   Chronic pain  of left knee 02/22/2024   Glaucoma 02/22/2024   Aortic atherosclerosis (HCC) 02/22/2024   Coronary atherosclerosis 02/22/2024   Fall 02/22/2024   Hyponatremia 07/29/2017   Hypertension 07/29/2017   Generalized weakness    Hypothyroidism    PCP:  Pearson Grippe, MD Pharmacy:   [facility] [phone] [address], [place] - [address] [place] [address] Phone: [phone] Fax: [phone]  Social Drivers of Health (SDOH) Social History: SDOH Screenings   Food Insecurity: No Food Insecurity (02/22/2024)  Housing: Low Risk  (02/22/2024)  Transportation Needs: No Transportation Needs (02/22/2024)  Utilities: Not At Risk (02/22/2024)  Social Connections: Moderately Integrated (02/22/2024)  Tobacco Use: Low Risk  (02/22/2024)   SDOH Interventions:    Readmission Risk Interventions     No data to display

## 2024-02-23 NOTE — Progress Notes (Signed)
 PROGRESS NOTE    Tammy Hampton  ZOX:[id number] DOB: 1931-10-23 DOA: 02/22/2024 PCP: Tammy Grippe, MD    Brief Narrative:  Tammy Hampton is a 89 y.o. female with medical history significant of glaucoma, hypertension, hypothyroidism, hyponatremia, diverticulosis, constipation, GERD, history of rectal bleeding who presented to the emergency department from her independent living facility due to having a fall several minutes after going having a bowel movement.  The patient stated that after she got out of the bathroom she went to the kitchen to get some water.  She tried to going 1 direction in, felt like her left knee buckled and fell, but she is not able to tell me all the details.  She denied loss of consciousness.  From [facility]  Assessment and Plan:  Fall with  syncope -echo shows HOCM and some AKI so suspect patient got dehydrated -avoid norvasc/ACe/ARB -Correct electrolyte abnormality. - carotid Doppler.      Hyponatremia -trend Na     Hypertension Holding antihypertensives     Aortic atherosclerosis (HCC)   Coronary atherosclerosis No longer taking lovastatin. Follow-up with primary care provider.     Hypothyroidism -continue home meds     Periorbital contusion of right eye Analgesics as needed. SCDs for DVT prophylaxis.     Chronic pain of left knee Physical therapy consulted.     Glaucoma Continue Cosopt and Xalatan eyedrops. Follow-up with ophthalmology as an outpatient.  DVT prophylaxis: SCDs Start: 02/22/24 0839    Code Status: Full Code Family Communication: called nephew  Disposition Plan:  Level of care: Telemetry Status is: Observation     Consultants:  PT   Subjective: Denies SOB, some knee pain  Objective: Vitals:   02/22/24 1537 02/22/24 2035 02/22/24 2353 02/23/24 0512  BP: (!) 167/60 (!) 121/55 (!) 102/56 (!) 108/51  Pulse: 66 86 74 75  Resp: 18 (!) 22 18 (!) 22  Temp: 98.8 F (37.1 C) 99.3 F (37.4 C) 99.2 F (37.3  C) 98.7 F (37.1 C)  TempSrc: Oral Oral Oral Oral  SpO2: 95% 95% 95% 92%  Weight:    61.7 kg  Height:        Intake/Output Summary (Last 24 hours) at 02/23/2024 1257 Last data filed at 02/23/2024 0145 Gross per 24 hour  Intake 600 ml  Output 150 ml  Net 450 ml   Filed Weights   02/22/24 0458 02/23/24 0512  Weight: 54.4 kg 61.7 kg    Examination:   General: Appearance:    Well developed, well nourished female in no acute distress     Lungs:      respirations unlabored  Heart:    Normal heart rate. Normal rhythm. No murmurs, rubs, or gallops.    MS:   All extremities are intact.    Neurologic:   Awake, alert       Data Reviewed: I have personally reviewed following labs and imaging studies  CBC: Recent Labs  Lab 02/22/24 0517 02/23/24 0412  WBC 11.5* 11.1*  NEUTROABS 9.7*  --   HGB 11.0* 10.1*  HCT 33.3* 31.0*  MCV 96.8 99.7  PLT 209 178   Basic Metabolic Panel: Recent Labs  Lab 02/22/24 0517 02/22/24 1658 02/23/24 0412  NA 130*  --  130*  K 3.9  --  3.5  CL 98  --  99  CO2 21*  --  19*  GLUCOSE 147*  --  96  BUN 31*  --  30*  CREATININE 1.21*  --  1.09*  CALCIUM 8.7*  --  8.2*  MG  --  2.4  --   PHOS  --  3.3  --    GFR: Estimated Creatinine Clearance: 29 mL/min (A) (by C-G formula based on SCr of 1.09 mg/dL (H)). Liver Function Tests: Recent Labs  Lab 02/22/24 0517 02/23/24 0412  AST 25 20  ALT 16 15  ALKPHOS 65 55  BILITOT 1.6* 1.1  PROT 7.2 6.3*  ALBUMIN 3.9 3.3*   No results for input(s): "LIPASE", "AMYLASE" in the last 168 hours. No results for input(s): "AMMONIA" in the last 168 hours. Coagulation Profile: No results for input(s): "INR", "PROTIME" in the last 168 hours. Cardiac Enzymes: No results for input(s): "CKTOTAL", "CKMB", "CKMBINDEX", "TROPONINI" in the last 168 hours. BNP (last 3 results) No results for input(s): "PROBNP" in the last 8760 hours. HbA1C: No results for input(s): "HGBA1C" in the last 72  hours. CBG: Recent Labs  Lab 02/23/24 0506  GLUCAP 109*   Lipid Profile: No results for input(s): "CHOL", "HDL", "LDLCALC", "TRIG", "CHOLHDL", "LDLDIRECT" in the last 72 hours. Thyroid Function Tests: Recent Labs    02/22/24 1658  TSH 1.563   Anemia Panel: No results for input(s): "VITAMINB12", "FOLATE", "FERRITIN", "TIBC", "IRON", "RETICCTPCT" in the last 72 hours. Sepsis Labs: No results for input(s): "PROCALCITON", "LATICACIDVEN" in the last 168 hours.  No results found for this or any previous visit (from the past 240 hours).       Radiology Studies: ECHOCARDIOGRAM COMPLETE Result Date: 02/22/2024    ECHOCARDIOGRAM REPORT   Patient Name:   Tammy Hampton Date of Exam: 02/22/2024 Medical Rec #:  [id number]         Height:       62.0 in Accession #:    [id number]        Weight:       120.0 lb Date of Birth:  [birth year]          BSA:          1.539 m Patient Age:    89 years          BP:           127/51 mmHg Patient Gender: F                 HR:           64 bpm. Exam Location:  Inpatient Procedure: 2D Echo, Color Doppler and Cardiac Doppler (Both Spectral and Color            Flow Doppler were utilized during procedure). Indications:    R55 Syncope  History:        Patient has no prior history of Echocardiogram examinations.                 Risk Factors:Hypertension.  Sonographer:    Maxwell Marion Referring Phys: [id number] DAVID MANUEL ORTIZ IMPRESSIONS  1. Left ventricular ejection fraction, by estimation, is 60 to 65%. The left ventricle has normal function. The left ventricle has no regional wall motion abnormalities. There is moderate asymmetric left ventricular hypertrophy of the basal-septal segment. There is an LV outflow tract gradient present. Hard to separate out the LVOT signal from the MR signal, but peak gradient appears to be about 55 mmHg. Left ventricular diastolic parameters are consistent with Grade I diastolic dysfunction (impaired relaxation).  2. Right ventricular  systolic function is normal. The right ventricular size is normal. There is normal pulmonary artery systolic pressure. The estimated right  ventricular systolic pressure is 26.8 mmHg.  3. Left atrial size was mildly dilated.  4. There is mild mitral valve systolic anterior motion. The mitral valve is degenerative. Mild mitral valve regurgitation. Mild mitral stenosis. The mean mitral valve gradient is 5.0 mmHg.  5. The aortic valve is tricuspid. There is moderate calcification of the aortic valve. Aortic valve regurgitation is not visualized. Aortic valve sclerosis/calcification is present, without any evidence of aortic stenosis.  6. The inferior vena cava is normal in size with greater than 50% respiratory variability, suggesting right atrial pressure of 3 mmHg.  7. The patient has HOCM physiology. FINDINGS  Left Ventricle: Left ventricular ejection fraction, by estimation, is 60 to 65%. The left ventricle has normal function. The left ventricle has no regional wall motion abnormalities. The left ventricular internal cavity size was normal in size. There is  moderate asymmetric left ventricular hypertrophy of the basal-septal segment. Left ventricular diastolic parameters are consistent with Grade I diastolic dysfunction (impaired relaxation). Right Ventricle: The right ventricular size is normal. No increase in right ventricular wall thickness. Right ventricular systolic function is normal. There is normal pulmonary artery systolic pressure. The tricuspid regurgitant velocity is 2.44 m/s, and  with an assumed right atrial pressure of 3 mmHg, the estimated right ventricular systolic pressure is 26.8 mmHg. Left Atrium: Left atrial size was mildly dilated. Right Atrium: Right atrial size was normal in size. Pericardium: There is no evidence of pericardial effusion. Mitral Valve: There is mild mitral valve systolic anterior motion. The mitral valve is degenerative in appearance. There is moderate calcification of the  mitral valve leaflet(s). Mild to moderate mitral annular calcification. Mild mitral valve regurgitation. Mild mitral valve stenosis. MV peak gradient, 11.4 mmHg. The mean mitral valve gradient is 5.0 mmHg. Tricuspid Valve: The tricuspid valve is normal in structure. Tricuspid valve regurgitation is trivial. Aortic Valve: The aortic valve is tricuspid. There is moderate calcification of the aortic valve. Aortic valve regurgitation is not visualized. Aortic valve sclerosis/calcification is present, without any evidence of aortic stenosis. Aortic valve mean gradient measures 8.0 mmHg. Aortic valve peak gradient measures 13.8 mmHg. Pulmonic Valve: The pulmonic valve was normal in structure. Pulmonic valve regurgitation is mild. Aorta: The aortic root is normal in size and structure. Venous: The inferior vena cava is normal in size with greater than 50% respiratory variability, suggesting right atrial pressure of 3 mmHg. IAS/Shunts: No atrial level shunt detected by color flow Doppler.  LEFT VENTRICLE PLAX 2D LVIDd:         3.70 cm      Diastology LVIDs:         2.50 cm      LV e' medial:    4.03 cm/s LV PW:         1.30 cm      LV E/e' medial:  32.8 LV IVS:        1.40 cm      LV e' lateral:   5.22 cm/s LVOT diam:     1.40 cm      LV E/e' lateral: 25.3 LVOT Area:     1.54 cm  LV Volumes (MOD) LV vol d, MOD A2C: 93.7 ml LV vol d, MOD A4C: 119.0 ml LV vol s, MOD A2C: 33.1 ml LV vol s, MOD A4C: 57.3 ml LV SV MOD A2C:     60.6 ml LV SV MOD A4C:     119.0 ml LV SV MOD BP:      62.3 ml RIGHT  VENTRICLE RV S prime:     15.40 cm/s TAPSE (M-mode): 2.2 cm LEFT ATRIUM             Index LA Vol (A2C):   49.2 ml 31.98 ml/m LA Vol (A4C):   39.5 ml 25.67 ml/m LA Biplane Vol: 46.8 ml 30.42 ml/m  AORTIC VALVE AV Vmax:      186.00 cm/s AV Vmean:     129.000 cm/s AV VTI:       0.450 m AV Peak Grad: 13.8 mmHg AV Mean Grad: 8.0 mmHg  AORTA Ao Asc diam: 3.70 cm MITRAL VALVE                TRICUSPID VALVE MV Area (PHT): 4.07 cm     TR Peak  grad:   23.8 mmHg MV Peak grad:  11.4 mmHg    TR Vmax:        244.00 cm/s MV Mean grad:  5.0 mmHg MV Vmax:       1.69 m/s     SHUNTS MV Vmean:      108.0 cm/s   Systemic Diam: 1.40 cm MV E velocity: 132.00 cm/s MV A velocity: 157.00 cm/s MV E/A ratio:  0.84 Dalton McleanMD Electronically signed by Wilfred Lacy Signature Date/Time: 02/22/2024/3:39:34 PM    Final    CT Angio Chest PE W/Cm &/Or Wo Cm Result Date: 02/22/2024 CLINICAL DATA:  Positive D-dimer, shortness of breath, dizziness. Pulmonary embolism suspected. EXAM: CT ANGIOGRAPHY CHEST WITH CONTRAST TECHNIQUE: Multidetector CT imaging of the chest was performed using the standard protocol during bolus administration of intravenous contrast. Multiplanar CT image reconstructions and MIPs were obtained to evaluate the vascular anatomy. RADIATION DOSE REDUCTION: This exam was performed according to the departmental dose-optimization program which includes automated exposure control, adjustment of the mA and/or kV according to patient size and/or use of iterative reconstruction technique. CONTRAST:  60mL OMNIPAQUE IOHEXOL 350 MG/ML SOLN COMPARISON:  Most recent portable chest was today, before that PA and lateral chest 07/29/2017. Comparison is made with chest, abdomen and pelvis CT with contrast 03/12/2013. FINDINGS: Cardiovascular: There is an enlarged pulmonary trunk, 3.2 cm indicating arterial hypertension, was previously 2.7 cm. There is good arterial opacification throughout and no evidence of arterial embolus. There are patchy calcific plaques along the aorta and scattered calcification in the great vessels without aneurysm, stenosis, or dissection. There are few small foci of air in the right internal mammary vein, probably either injected with the contrast or at the time of IV insertion. No air is seen in the pulmonary arteries. There is mild-to-moderate cardiomegaly with a left chamber predominance. There is a small amount of scattered left main and 2  vessel coronary calcific plaque in the right coronary artery and LAD. The pulmonary veins are prominent superiorly. No pericardial effusion. Mediastinum/Nodes: Again seen is a very small right lobe of the thyroid, absence of the left lobe. No thyroid nodule or axillary mass. There is no intrathoracic adenopathy. The trachea and both of the main bronchi are clear. Thoracic esophagus is unremarkable.  There is a small hiatal hernia. Lungs/Pleura: Posteriorly there are hazy opacities in the lower lobes consistent with atelectasis. There is diffuse bronchial thickening. There is no consolidation, effusion or nodule. There is no pneumothorax. There is biapical scarring change. Upper Abdomen: No acute abnormality. Status post cholecystectomy. There is a 2.4 cm periampullary duodenal diverticulum. There is abdominal aortic atherosclerosis. Musculoskeletal: Osteopenia and degenerative change thoracic spine. No acute or significant osseous findings. Unremarkable chest wall.  Review of the MIP images confirms the above findings. IMPRESSION: 1. No evidence of arterial embolus. Enlarged pulmonary trunk indicating arterial hypertension. 2. Cardiomegaly with left chamber predominance. 3. Aortic and coronary artery atherosclerosis. 4. Prominent superior pulmonary veins.  No visible edema. 5. Bronchitis.  No focal infiltrate. 6. Small hiatal hernia. 7. 2.4 cm periampullary duodenal diverticulum. 8. Osteopenia and degenerative change. Electronically Signed   By: Almira Bar M.D.   On: 02/22/2024 07:02   CT Head Wo Contrast Result Date: 02/22/2024 CLINICAL DATA:  Dizziness and fall injury with right facial impact and neck pain. EXAM: CT HEAD WITHOUT CONTRAST CT CERVICAL SPINE WITHOUT CONTRAST TECHNIQUE: Multidetector CT imaging of the head and cervical spine was performed following the standard protocol without intravenous contrast. Multiplanar CT image reconstructions of the cervical spine were also generated. RADIATION DOSE  REDUCTION: This exam was performed according to the departmental dose-optimization program which includes automated exposure control, adjustment of the mA and/or kV according to patient size and/or use of iterative reconstruction technique. COMPARISON:  Most recent study is MRI brain 07/21/2014. No prior neck or cervical spine CT. FINDINGS: CT HEAD FINDINGS Brain: There is mild global atrophy with atrophic ventriculomegaly and mild-to-moderate small vessel disease of the cerebral white matter. No cortical based infarct, hemorrhage, mass effect or midline shift are seen. There are calcifications along the cortical surface in the parasagittal high right frontal lobe on series 3 axial 24 and 25. These do not appear to have been present previously. This could be due to residua from a remote interval subarachnoid bleed or infection. Vascular: Moderate patchy calcific plaque both siphons. No hyperdense central vessel is seen. Skull: Negative for fractures or focal lesions. Swelling extends mildly over the right brow and right cheek with no other focal scalp abnormality. Sinuses/Orbits: Old lens replacements with no new orbital abnormality. Visualized sinuses and mastoid air cells are clear. Other: None. CT CERVICAL SPINE FINDINGS Alignment: Normal. Skull base and vertebrae: No acute fracture is evident. No primary bone lesion or focal pathologic process. There is osteopenia. Soft tissues and spinal canal: No prevertebral fluid or swelling. No visible canal hematoma. There are mild calcifications at the carotid bifurcations. No laryngeal or thyroid mass. Disc levels: There is degenerative disc space loss, endplate Schmorl's nodes and bidirectional endplate spurring at C5-6 and C6-7. The other discs are normal heights. No herniated discs or cord compromise are seen. There is multilevel mild facet joint spurring, along with uncinate hypertrophy causing moderate left greater than right foraminal stenosis at C5-6. The other  foramina are clear. Upper chest: Biapical scarring changes. Scattered calcific plaque in the great vessels. Other: There is bone-on-bone joint space loss and remodeling of both TMJs. IMPRESSION: 1. No acute intracranial CT findings or depressed skull fractures. 2. Atrophy and small-vessel disease. 3. Calcifications along the cortical surface in the parasagittal high right frontal lobe which do not appear to have been present on 2015 MRI. This could be due to residua from a remote interval subarachnoid bleed or infection. 4. Osteopenia and degenerative change without evidence of cervical fractures. 5. Great vessel and carotid atherosclerosis. 6. Bilateral TMJ arthropathy. Electronically Signed   By: Almira Bar M.D.   On: 02/22/2024 06:43   CT Cervical Spine Wo Contrast Result Date: 02/22/2024 CLINICAL DATA:  Dizziness and fall injury with right facial impact and neck pain. EXAM: CT HEAD WITHOUT CONTRAST CT CERVICAL SPINE WITHOUT CONTRAST TECHNIQUE: Multidetector CT imaging of the head and cervical spine was performed following the standard  protocol without intravenous contrast. Multiplanar CT image reconstructions of the cervical spine were also generated. RADIATION DOSE REDUCTION: This exam was performed according to the departmental dose-optimization program which includes automated exposure control, adjustment of the mA and/or kV according to patient size and/or use of iterative reconstruction technique. COMPARISON:  Most recent study is MRI brain 07/21/2014. No prior neck or cervical spine CT. FINDINGS: CT HEAD FINDINGS Brain: There is mild global atrophy with atrophic ventriculomegaly and mild-to-moderate small vessel disease of the cerebral white matter. No cortical based infarct, hemorrhage, mass effect or midline shift are seen. There are calcifications along the cortical surface in the parasagittal high right frontal lobe on series 3 axial 24 and 25. These do not appear to have been present previously.  This could be due to residua from a remote interval subarachnoid bleed or infection. Vascular: Moderate patchy calcific plaque both siphons. No hyperdense central vessel is seen. Skull: Negative for fractures or focal lesions. Swelling extends mildly over the right brow and right cheek with no other focal scalp abnormality. Sinuses/Orbits: Old lens replacements with no new orbital abnormality. Visualized sinuses and mastoid air cells are clear. Other: None. CT CERVICAL SPINE FINDINGS Alignment: Normal. Skull base and vertebrae: No acute fracture is evident. No primary bone lesion or focal pathologic process. There is osteopenia. Soft tissues and spinal canal: No prevertebral fluid or swelling. No visible canal hematoma. There are mild calcifications at the carotid bifurcations. No laryngeal or thyroid mass. Disc levels: There is degenerative disc space loss, endplate Schmorl's nodes and bidirectional endplate spurring at C5-6 and C6-7. The other discs are normal heights. No herniated discs or cord compromise are seen. There is multilevel mild facet joint spurring, along with uncinate hypertrophy causing moderate left greater than right foraminal stenosis at C5-6. The other foramina are clear. Upper chest: Biapical scarring changes. Scattered calcific plaque in the great vessels. Other: There is bone-on-bone joint space loss and remodeling of both TMJs. IMPRESSION: 1. No acute intracranial CT findings or depressed skull fractures. 2. Atrophy and small-vessel disease. 3. Calcifications along the cortical surface in the parasagittal high right frontal lobe which do not appear to have been present on 2015 MRI. This could be due to residua from a remote interval subarachnoid bleed or infection. 4. Osteopenia and degenerative change without evidence of cervical fractures. 5. Great vessel and carotid atherosclerosis. 6. Bilateral TMJ arthropathy. Electronically Signed   By: Almira Bar M.D.   On: 02/22/2024 06:43   DG  Chest Port 1 View Result Date: 02/22/2024 CLINICAL DATA:  Fall with shortness of breath. EXAM: PORTABLE CHEST 1 VIEW COMPARISON:  07/29/2017 FINDINGS: Low volume film. The cardio pericardial silhouette is enlarged. Vascular congestion noted. Diffuse interstitial prominence may be chronic although a component of interstitial edema is not excluded. No pneumothorax or pleural effusion. Bones are diffusely demineralized. Telemetry leads overlie the chest. IMPRESSION: Low volume film with vascular congestion and possible interstitial edema. Electronically Signed   By: Kennith Center M.D.   On: 02/22/2024 05:41   DG Knee Complete 4 Views Left Result Date: 02/22/2024 CLINICAL DATA:  Fall.  Pain and swelling to the left knee. EXAM: LEFT KNEE - COMPLETE 4+ VIEW COMPARISON:  None Available. FINDINGS: No evidence for an acute fracture. No subluxation or dislocation. Marked degenerative changes are noted in all 3 compartments with complete loss of joint space in the medial compartment. Marked hypertrophic spurring is seen in the medial and patellofemoral compartments with more modest spurring in the lateral  compartment. There is calcification of the lateral meniscus. Multiple mineralized intra-articular loose bodies are seen within the joint space anteriorly and posteriorly with evidence of joint effusion. IMPRESSION: 1. No acute bony findings. 2. Marked degenerative changes in all 3 compartments with complete loss of joint space in the medial compartment. 3. Multiple mineralized intra-articular loose bodies. Electronically Signed   By: Kennith Center M.D.   On: 02/22/2024 05:34        Scheduled Meds:  dorzolamide-timolol  1 drop Both Eyes BID   latanoprost  1 drop Both Eyes QHS   levothyroxine  75 mcg Oral Q0600   sodium chloride flush  3 mL Intravenous Q12H   Continuous Infusions:   LOS: 0 days    Time spent: 45 minutes spent on chart review, discussion with nursing staff, consultants, updating family and  interview/physical exam; more than 50% of that time was spent in counseling and/or coordination of care.    Joseph Art, DO [facility] Available via Epic secure chat 7am-7pm After these hours, please refer to coverage provider listed on [email or web address] 02/23/2024, 12:57 PM

## 2024-02-23 NOTE — Evaluation (Signed)
 Physical Therapy Evaluation Patient Details Name: Tammy Hampton MRN: [id number] DOB: 1931-07-26 Today's Date: 02/23/2024  History of Present Illness  Pt is a 89 year old female preseting to ED following a near syncopal event with fall resulting in head contusion.  CT head, cervical spine is negative for acute abnormality.  Pt presenting with Periorbital contusion of right eye and edematous painful left knee (xray (-) for acute bony findings).  PMhx: glaucoma, hypertension, hypothyroidism, hyponatremia, diverticulosis, constipation, GERD, history of rectal bleeding  Clinical Impression  Pt admitted with above diagnosis.  Pt currently with functional limitations due to the deficits listed below (see PT Problem List). Pt will benefit from acute skilled PT to increase their independence and safety with mobility to allow discharge.  Pt presents with poor short term memory.  Pt able to recall her fall but states she doesn't understand why her left knee is so painful now.  Pt assisted with ambulating and requiring cues for use of RW and pain control.  Pt reports being independent and no use of assistive device.  Brother reports she lives in independent living facility.  Pt states she is typically able to ambulate to dining hall.  Pt does not appear safe to return home alone.  Patient will benefit from continued inpatient follow up therapy, <3 hours/day, especially if no assist available upon d/c.          If plan is discharge home, recommend the following: A little help with walking and/or transfers;A little help with bathing/dressing/bathroom;Assistance with cooking/housework;Help with stairs or ramp for entrance;Supervision due to cognitive status   Can travel by private vehicle   Yes    Equipment Recommendations Rolling walker (2 wheels)  Recommendations for Other Services       Functional Status Assessment Patient has had a recent decline in their functional status and demonstrates the ability  to make significant improvements in function in a reasonable and predictable amount of time.     Precautions / Restrictions Precautions Precautions: Fall Precaution/Restrictions Comments: poor memory (no formal diagnoses in medical history)      Mobility  Bed Mobility               General bed mobility comments: pt in recliner on arrival    Transfers Overall transfer level: Needs assistance Equipment used: Rolling walker (2 wheels) Transfers: Sit to/from Stand Sit to Stand: Min assist           General transfer comment: verbal cues for UE and LE positioning for pain control, assist for controlled descent    Ambulation/Gait Ambulation/Gait assistance: Min assist, Contact guard assist Gait Distance (Feet): 150 Feet Assistive device: Rolling walker (2 wheels) Gait Pattern/deviations: Step-to pattern, Decreased stance time - left, Antalgic       General Gait Details: verbal cues for use of RW including positioning, step length, pattern; pt reports increased left knee pain so encouraged more weight bearing on UEs through RW, distance per pt preference  Stairs            Wheelchair Mobility     Tilt Bed    Modified Rankin (Stroke Patients Only)       Balance Overall balance assessment: History of Falls                                           Pertinent Vitals/Pain Pain Assessment Pain Assessment: Faces  Faces Pain Scale: Hurts even more Pain Location: left knee Pain Descriptors / Indicators: Sore, Tender, Guarding, Grimacing Pain Intervention(s): Repositioned, Monitored during session, Ice applied (brother reports she had tylenol already for pain, pt does not remember this)    Home Living Family/patient expects to be discharged to:: Private residence Living Arrangements: Alone   Type of Home: Independent living facility           Home Equipment: Other (comment) (brother reports she has an upright walker but suspects she  doesn't use it)      Prior Function Prior Level of Function : Independent/Modified Independent             Mobility Comments: denies assistive device use, ambulates to dining hall ADLs Comments: reports independence     Extremity/Trunk Assessment        Lower Extremity Assessment Lower Extremity Assessment: Generalized weakness;LLE deficits/detail LLE Deficits / Details: edematous left knee, pt reports increased pain, typically wears a brace on this knee per her report       Communication   Communication Communication: Impaired Factors Affecting Communication: Hearing impaired    Cognition Arousal: Alert Behavior During Therapy: WFL for tasks assessed/performed   PT - Cognitive impairments: Memory                       PT - Cognition Comments: observed deficits in memory (although no formal cognitive diagnoses in medical history); brother confirms poor short term memory; pt perseverating on no assist and no staff in her room today however brother and RN report otherwise Following commands: Intact       Cueing       General Comments      Exercises     Assessment/Plan    PT Assessment Patient needs continued PT services  PT Problem List Decreased strength;Decreased range of motion;Decreased balance;Decreased mobility;Decreased knowledge of use of DME;Pain       PT Treatment Interventions DME instruction;Gait training;Balance training;Functional mobility training;Therapeutic activities;Therapeutic exercise;Patient/family education    PT Goals (Current goals can be found in the Care Plan section)  Acute Rehab PT Goals PT Goal Formulation: With patient Time For Goal Achievement: 03/08/24 Potential to Achieve Goals: Good    Frequency Min 2X/week     Co-evaluation               AM-PAC PT "6 Clicks" Mobility  Outcome Measure Help needed turning from your back to your side while in a flat bed without using bedrails?: A Little Help needed  moving from lying on your back to sitting on the side of a flat bed without using bedrails?: A Little Help needed moving to and from a bed to a chair (including a wheelchair)?: A Little Help needed standing up from a chair using your arms (e.g., wheelchair or bedside chair)?: A Little Help needed to walk in hospital room?: A Lot Help needed climbing 3-5 steps with a railing? : A Lot 6 Click Score: 16    End of Session Equipment Utilized During Treatment: Gait belt Activity Tolerance: Patient limited by pain Patient left: in chair;with call bell/phone within reach;with chair alarm set;with family/visitor present Nurse Communication: Mobility status PT Visit Diagnosis: Difficulty in walking, not elsewhere classified (R26.2);Pain Pain - Right/Left: Left Pain - part of body: Knee    Time: 1610-9604 PT Time Calculation (min) (ACUTE ONLY): 20 min   Charges:   PT Evaluation $PT Eval Low Complexity: 1 Low   PT General Charges $$  ACUTE PT VISIT: 1 Visit       Thomasene Mohair PT, DPT Physical Therapist Acute Rehabilitation Services Office: [phone]   Janan Halter [name] 02/23/2024, 3:29 PM

## 2024-02-24 DIAGNOSIS — R55 Syncope and collapse: Secondary | ICD-10-CM | POA: Diagnosis not present

## 2024-02-24 LAB — BASIC METABOLIC PANEL WITH GFR
Anion gap: 9 (ref 5–15)
BUN: 34 mg/dL — ABNORMAL HIGH (ref 8–23)
CO2: 19 mmol/L — ABNORMAL LOW (ref 22–32)
Calcium: 8.1 mg/dL — ABNORMAL LOW (ref 8.9–10.3)
Chloride: 100 mmol/L (ref 98–111)
Creatinine, Ser: 1.12 mg/dL — ABNORMAL HIGH (ref 0.44–1.00)
GFR, Estimated: 46 mL/min — ABNORMAL LOW (ref >60)
Glucose, Bld: 112 mg/dL — ABNORMAL HIGH (ref 70–99)
Potassium: 3.2 mmol/L — ABNORMAL LOW (ref 3.5–5.1)
Sodium: 128 mmol/L — ABNORMAL LOW (ref 135–145)

## 2024-02-24 LAB — CBC
HCT: 28.4 % — ABNORMAL LOW (ref 36.0–46.0)
Hemoglobin: 9.5 g/dL — ABNORMAL LOW (ref 12.0–15.0)
MCH: 32.4 pg (ref 26.0–34.0)
MCHC: 33.5 g/dL (ref 30.0–36.0)
MCV: 96.9 fL (ref 80.0–100.0)
Platelets: 173 10*3/uL (ref 150–400)
RBC: 2.93 MIL/uL — ABNORMAL LOW (ref 3.87–5.11)
RDW: 11.9 % (ref 11.5–15.5)
WBC: 7.5 10*3/uL (ref 4.0–10.5)
nRBC: 0 % (ref 0.0–0.2)

## 2024-02-24 LAB — GLUCOSE, CAPILLARY: Glucose-Capillary: 104 mg/dL — ABNORMAL HIGH (ref 70–99)

## 2024-02-24 MED ORDER — POTASSIUM CHLORIDE CRYS ER 20 MEQ PO TBCR
40.0000 meq | EXTENDED_RELEASE_TABLET | Freq: Once | ORAL | Status: AC
  Administered 2024-02-24: 40 meq via ORAL
  Filled 2024-02-24: qty 2

## 2024-02-24 NOTE — Progress Notes (Signed)
 PROGRESS NOTE    Tammy Hampton  WUJ:[id number] DOB: 1931-01-09 DOA: 02/22/2024 PCP: Pearson Grippe, MD    Brief Narrative:  Tammy Hampton is a 89 y.o. female with medical history significant of glaucoma, hypertension, hypothyroidism, hyponatremia, diverticulosis, constipation, GERD, history of rectal bleeding who presented to the emergency department from her independent living facility due to having a fall several minutes after going having a bowel movement.  The patient stated that after she got out of the bathroom she went to the kitchen to get some water.  She tried to going 1 direction in, felt like her left knee buckled and fell, but she is not able to tell me all the details.  She denied loss of consciousness.  From [facility]-- needs SNF  Assessment and Plan:  Fall with  syncope -echo shows HOCM and labs some AKI so suspect patient got dehydrated -avoid norvasc/ACe/ARB -Correct electrolyte abnormality. - carotid Doppler not done      Hyponatremia -trend Na     Hypertension Holding antihypertensives     Aortic atherosclerosis (HCC)   Coronary atherosclerosis No longer taking lovastatin. Follow-up with primary care provider.     Hypothyroidism -continue home meds     Periorbital contusion of right eye Analgesics as needed. SCDs for DVT prophylaxis.     Chronic pain of left knee Physical therapy consulted.     Glaucoma Continue Cosopt and Xalatan eyedrops. Follow-up with ophthalmology as an outpatient.  DVT prophylaxis: SCDs Start: 02/22/24 0839    Code Status: Full Code Family Communication: family at bedside  Disposition Plan:  Level of care: Telemetry Status is: Observation     Consultants:  PT   Subjective: Visiting with family at bedside  Objective: Vitals:   02/23/24 1358 02/23/24 2101 02/24/24 0445 02/24/24 0500  BP: (!) 163/63 (!) 145/56 (!) 116/54   Pulse: 66 69 68   Resp: 16 20 20    Temp: 98 F (36.7 C) 99.6 F (37.6 C) 98.4  F (36.9 C)   TempSrc: Oral Oral Oral   SpO2: 100% 97% 92%   Weight:    63.8 kg  Height:        Intake/Output Summary (Last 24 hours) at 02/24/2024 1220 Last data filed at 02/24/2024 0845 Gross per 24 hour  Intake 300 ml  Output --  Net 300 ml   Filed Weights   02/22/24 0458 02/23/24 0512 02/24/24 0500  Weight: 54.4 kg 61.7 kg 63.8 kg    Examination:    General: Appearance:     Overweight female in no acute distress     Lungs:     respirations unlabored  Heart:    Normal heart rate. Normal rhythm. No murmurs, rubs, or gallops.   MS:   All extremities are intact.   Neurologic:   Awake, alert       Data Reviewed: I have personally reviewed following labs and imaging studies  CBC: Recent Labs  Lab 02/22/24 0517 02/23/24 0412 02/24/24 0356  WBC 11.5* 11.1* 7.5  NEUTROABS 9.7*  --   --   HGB 11.0* 10.1* 9.5*  HCT 33.3* 31.0* 28.4*  MCV 96.8 99.7 96.9  PLT 209 178 173   Basic Metabolic Panel: Recent Labs  Lab 02/22/24 0517 02/22/24 1658 02/23/24 0412 02/24/24 0356  NA 130*  --  130* 128*  K 3.9  --  3.5 3.2*  CL 98  --  99 100  CO2 21*  --  19* 19*  GLUCOSE 147*  --  96 112*  BUN 31*  --  30* 34*  CREATININE 1.21*  --  1.09* 1.12*  CALCIUM 8.7*  --  8.2* 8.1*  MG  --  2.4  --   --   PHOS  --  3.3  --   --    GFR: Estimated Creatinine Clearance: 28.7 mL/min (A) (by C-G formula based on SCr of 1.12 mg/dL (H)). Liver Function Tests: Recent Labs  Lab 02/22/24 0517 02/23/24 0412  AST 25 20  ALT 16 15  ALKPHOS 65 55  BILITOT 1.6* 1.1  PROT 7.2 6.3*  ALBUMIN 3.9 3.3*   No results for input(s): "LIPASE", "AMYLASE" in the last 168 hours. No results for input(s): "AMMONIA" in the last 168 hours. Coagulation Profile: No results for input(s): "INR", "PROTIME" in the last 168 hours. Cardiac Enzymes: No results for input(s): "CKTOTAL", "CKMB", "CKMBINDEX", "TROPONINI" in the last 168 hours. BNP (last 3 results) No results for input(s): "PROBNP" in the  last 8760 hours. HbA1C: No results for input(s): "HGBA1C" in the last 72 hours. CBG: Recent Labs  Lab 02/23/24 0506 02/24/24 0441  GLUCAP 109* 104*   Lipid Profile: No results for input(s): "CHOL", "HDL", "LDLCALC", "TRIG", "CHOLHDL", "LDLDIRECT" in the last 72 hours. Thyroid Function Tests: Recent Labs    02/22/24 1658  TSH 1.563   Anemia Panel: No results for input(s): "VITAMINB12", "FOLATE", "FERRITIN", "TIBC", "IRON", "RETICCTPCT" in the last 72 hours. Sepsis Labs: No results for input(s): "PROCALCITON", "LATICACIDVEN" in the last 168 hours.  No results found for this or any previous visit (from the past 240 hours).       Radiology Studies: ECHOCARDIOGRAM COMPLETE Result Date: 02/22/2024    ECHOCARDIOGRAM REPORT   Patient Name:   Tammy Hampton Date of Exam: 02/22/2024 Medical Rec #:  [id number]         Height:       62.0 in Accession #:    [id number]        Weight:       120.0 lb Date of Birth:  04/03/1931          BSA:          1.539 m Patient Age:    89 years          BP:           127/51 mmHg Patient Gender: F                 HR:           64 bpm. Exam Location:  Inpatient Procedure: 2D Echo, Color Doppler and Cardiac Doppler (Both Spectral and Color            Flow Doppler were utilized during procedure). Indications:    R55 Syncope  History:        Patient has no prior history of Echocardiogram examinations.                 Risk Factors:Hypertension.  Sonographer:    Maxwell Marion Referring Phys: [id number] DAVID MANUEL ORTIZ IMPRESSIONS  1. Left ventricular ejection fraction, by estimation, is 60 to 65%. The left ventricle has normal function. The left ventricle has no regional wall motion abnormalities. There is moderate asymmetric left ventricular hypertrophy of the basal-septal segment. There is an LV outflow tract gradient present. Hard to separate out the LVOT signal from the MR signal, but peak gradient appears to be about 55 mmHg. Left ventricular diastolic parameters are  consistent with Grade I diastolic  dysfunction (impaired relaxation).  2. Right ventricular systolic function is normal. The right ventricular size is normal. There is normal pulmonary artery systolic pressure. The estimated right ventricular systolic pressure is 26.8 mmHg.  3. Left atrial size was mildly dilated.  4. There is mild mitral valve systolic anterior motion. The mitral valve is degenerative. Mild mitral valve regurgitation. Mild mitral stenosis. The mean mitral valve gradient is 5.0 mmHg.  5. The aortic valve is tricuspid. There is moderate calcification of the aortic valve. Aortic valve regurgitation is not visualized. Aortic valve sclerosis/calcification is present, without any evidence of aortic stenosis.  6. The inferior vena cava is normal in size with greater than 50% respiratory variability, suggesting right atrial pressure of 3 mmHg.  7. The patient has HOCM physiology. FINDINGS  Left Ventricle: Left ventricular ejection fraction, by estimation, is 60 to 65%. The left ventricle has normal function. The left ventricle has no regional wall motion abnormalities. The left ventricular internal cavity size was normal in size. There is  moderate asymmetric left ventricular hypertrophy of the basal-septal segment. Left ventricular diastolic parameters are consistent with Grade I diastolic dysfunction (impaired relaxation). Right Ventricle: The right ventricular size is normal. No increase in right ventricular wall thickness. Right ventricular systolic function is normal. There is normal pulmonary artery systolic pressure. The tricuspid regurgitant velocity is 2.44 m/s, and  with an assumed right atrial pressure of 3 mmHg, the estimated right ventricular systolic pressure is 26.8 mmHg. Left Atrium: Left atrial size was mildly dilated. Right Atrium: Right atrial size was normal in size. Pericardium: There is no evidence of pericardial effusion. Mitral Valve: There is mild mitral valve systolic anterior  motion. The mitral valve is degenerative in appearance. There is moderate calcification of the mitral valve leaflet(s). Mild to moderate mitral annular calcification. Mild mitral valve regurgitation. Mild mitral valve stenosis. MV peak gradient, 11.4 mmHg. The mean mitral valve gradient is 5.0 mmHg. Tricuspid Valve: The tricuspid valve is normal in structure. Tricuspid valve regurgitation is trivial. Aortic Valve: The aortic valve is tricuspid. There is moderate calcification of the aortic valve. Aortic valve regurgitation is not visualized. Aortic valve sclerosis/calcification is present, without any evidence of aortic stenosis. Aortic valve mean gradient measures 8.0 mmHg. Aortic valve peak gradient measures 13.8 mmHg. Pulmonic Valve: The pulmonic valve was normal in structure. Pulmonic valve regurgitation is mild. Aorta: The aortic root is normal in size and structure. Venous: The inferior vena cava is normal in size with greater than 50% respiratory variability, suggesting right atrial pressure of 3 mmHg. IAS/Shunts: No atrial level shunt detected by color flow Doppler.  LEFT VENTRICLE PLAX 2D LVIDd:         3.70 cm      Diastology LVIDs:         2.50 cm      LV e' medial:    4.03 cm/s LV PW:         1.30 cm      LV E/e' medial:  32.8 LV IVS:        1.40 cm      LV e' lateral:   5.22 cm/s LVOT diam:     1.40 cm      LV E/e' lateral: 25.3 LVOT Area:     1.54 cm  LV Volumes (MOD) LV vol d, MOD A2C: 93.7 ml LV vol d, MOD A4C: 119.0 ml LV vol s, MOD A2C: 33.1 ml LV vol s, MOD A4C: 57.3 ml LV SV MOD A2C:  60.6 ml LV SV MOD A4C:     119.0 ml LV SV MOD BP:      62.3 ml RIGHT VENTRICLE RV S prime:     15.40 cm/s TAPSE (M-mode): 2.2 cm LEFT ATRIUM             Index LA Vol (A2C):   49.2 ml 31.98 ml/m LA Vol (A4C):   39.5 ml 25.67 ml/m LA Biplane Vol: 46.8 ml 30.42 ml/m  AORTIC VALVE AV Vmax:      186.00 cm/s AV Vmean:     129.000 cm/s AV VTI:       0.450 m AV Peak Grad: 13.8 mmHg AV Mean Grad: 8.0 mmHg  AORTA Ao  Asc diam: 3.70 cm MITRAL VALVE                TRICUSPID VALVE MV Area (PHT): 4.07 cm     TR Peak grad:   23.8 mmHg MV Peak grad:  11.4 mmHg    TR Vmax:        244.00 cm/s MV Mean grad:  5.0 mmHg MV Vmax:       1.69 m/s     SHUNTS MV Vmean:      108.0 cm/s   Systemic Diam: 1.40 cm MV E velocity: 132.00 cm/s MV A velocity: 157.00 cm/s MV E/A ratio:  0.84 Dalton McleanMD Electronically signed by Wilfred Lacy Signature Date/Time: 02/22/2024/3:39:34 PM    Final         Scheduled Meds:  dorzolamide-timolol  1 drop Both Eyes BID   latanoprost  1 drop Both Eyes QHS   levothyroxine  75 mcg Oral Q0600   sodium chloride flush  3 mL Intravenous Q12H   Continuous Infusions:   LOS: 0 days    Time spent: 45 minutes spent on chart review, discussion with nursing staff, consultants, updating family and interview/physical exam; more than 50% of that time was spent in counseling and/or coordination of care.    Joseph Art, DO [facility] Available via Epic secure chat 7am-7pm After these hours, please refer to coverage provider listed on [email or web address] 02/24/2024, 12:20 PM

## 2024-02-24 NOTE — Progress Notes (Signed)
 Orthopedic Tech Progress Note Patient Details:  Tammy Hampton Jun 28, 1931 [id number]  Ortho Devices Type of Ortho Device: Knee Sleeve Ortho Device/Splint Location: Left knee Ortho Device/Splint Interventions: Ordered, Application, Adjustment   Post Interventions Patient Tolerated: Well Instructions Provided: Adjustment of device, Care of device  Tonye Pearson 02/24/2024, 1:29 PM

## 2024-02-24 NOTE — Progress Notes (Signed)
 Mobility Specialist - Progress Note   02/24/24 1245  Mobility  Activity Transferred from bed to chair  Level of Assistance Contact guard assist, steadying assist  Assistive Device Front wheel walker  Activity Response Tolerated well  Mobility Referral Yes  Mobility visit 1 Mobility  Mobility Specialist Start Time (ACUTE ONLY) 1233  Mobility Specialist Stop Time (ACUTE ONLY) 1245  Mobility Specialist Time Calculation (min) (ACUTE ONLY) 12 min   Pt received in bed and agreeable to mobility. Once standing, pt R knee kept giving out. Opted out to transfer to the recline with side stepping. No complaints during session. Pt to recliner after session with all needs met.    [facility]

## 2024-02-25 DIAGNOSIS — R55 Syncope and collapse: Secondary | ICD-10-CM | POA: Diagnosis not present

## 2024-02-25 LAB — CBC
HCT: 30.9 % — ABNORMAL LOW (ref 36.0–46.0)
Hemoglobin: 10.3 g/dL — ABNORMAL LOW (ref 12.0–15.0)
MCH: 32.5 pg (ref 26.0–34.0)
MCHC: 33.3 g/dL (ref 30.0–36.0)
MCV: 97.5 fL (ref 80.0–100.0)
Platelets: 219 10*3/uL (ref 150–400)
RBC: 3.17 MIL/uL — ABNORMAL LOW (ref 3.87–5.11)
RDW: 12.1 % (ref 11.5–15.5)
WBC: 7.3 10*3/uL (ref 4.0–10.5)
nRBC: 0 % (ref 0.0–0.2)

## 2024-02-25 LAB — BASIC METABOLIC PANEL WITH GFR
Anion gap: 8 (ref 5–15)
BUN: 27 mg/dL — ABNORMAL HIGH (ref 8–23)
CO2: 21 mmol/L — ABNORMAL LOW (ref 22–32)
Calcium: 8.6 mg/dL — ABNORMAL LOW (ref 8.9–10.3)
Chloride: 105 mmol/L (ref 98–111)
Creatinine, Ser: 1.11 mg/dL — ABNORMAL HIGH (ref 0.44–1.00)
GFR, Estimated: 47 mL/min — ABNORMAL LOW (ref >60)
Glucose, Bld: 112 mg/dL — ABNORMAL HIGH (ref 70–99)
Potassium: 4 mmol/L (ref 3.5–5.1)
Sodium: 134 mmol/L — ABNORMAL LOW (ref 135–145)

## 2024-02-25 LAB — GLUCOSE, CAPILLARY: Glucose-Capillary: 128 mg/dL — ABNORMAL HIGH (ref 70–99)

## 2024-02-25 MED ORDER — ACETAMINOPHEN 325 MG PO TABS: 650.0000 mg | ORAL_TABLET | Freq: Four times a day (QID) | ORAL | Status: AC | PRN

## 2024-02-25 MED ORDER — MELATONIN 5 MG PO TABS: 5.0000 mg | ORAL_TABLET | Freq: Every evening | ORAL | Status: AC | PRN

## 2024-02-25 NOTE — NC FL2 (Signed)
 [place] MEDICAID FL2 LEVEL OF CARE FORM     IDENTIFICATION  Patient Name: Tammy Hampton Birthdate: November 24, 1930 Sex: female Admission Date (Current Location): 02/22/2024  [facility] and [place] Number:  Producer, television/film/video and Address:  [facility],  [address] [place], [place] [address]      Provider Number: [id number]  Attending Physician Name and Address:  Joseph Art, DO  Relative Name and Phone Number:  maricella, filyaw (Niece)  [phone] [facility])    Current Level of Care: Hospital Recommended Level of Care: Skilled Nursing Facility Prior Approval Number:    Date Approved/Denied:   PASRR Number: [id number]  Discharge Plan: SNF    Current Diagnoses: Patient Active Problem List   Diagnosis Date Noted   Near syncope 02/22/2024   Constipation 02/22/2024   Periorbital contusion of right eye 02/22/2024   Chronic pain of left knee 02/22/2024   Glaucoma 02/22/2024   Aortic atherosclerosis (HCC) 02/22/2024   Coronary atherosclerosis 02/22/2024   Fall 02/22/2024   Hyponatremia 07/29/2017   Hypertension 07/29/2017   Generalized weakness    Hypothyroidism     Orientation RESPIRATION BLADDER Height & Weight     Self, Place  Normal Continent Weight: 139 lb 15.9 oz (63.5 kg) Height:  5\' 2"  (157.5 cm)  BEHAVIORAL SYMPTOMS/MOOD NEUROLOGICAL BOWEL NUTRITION STATUS      Continent Diet (heart)  AMBULATORY STATUS COMMUNICATION OF NEEDS Skin   Limited Assist Verbally Normal                       Personal Care Assistance Level of Assistance  Bathing, Feeding, Dressing Bathing Assistance: Limited assistance Feeding assistance: Independent Dressing Assistance: Limited assistance     Functional Limitations Info  Sight, Hearing, Speech Sight Info: Adequate Hearing Info: Adequate Speech Info: Adequate    SPECIAL CARE FACTORS FREQUENCY  OT (By licensed OT), PT (By licensed PT)     PT Frequency: 5 x a week OT Frequency: 5 x a week             Contractures Contractures Info: Not present    Additional Factors Info  Code Status, Allergies Code Status Info: full Allergies Info: Codeine  Macrobid (Nitrofurantoin Monohyd Macro)  Macrolides And Ketolides  Oxycodone-acetaminophen  Troleandomycin  Zithromax (Azithromycin)  Penicillins  Sulfa Antibiotics           Current Medications (02/25/2024):  This is the current hospital active medication list Current Facility-Administered Medications  Medication Dose Route Frequency Provider Last Rate Last Admin   acetaminophen (TYLENOL) tablet 650 mg  650 mg Oral Q6H PRN Bobette Mo, MD   650 mg at 02/23/24 2103   Or   acetaminophen (TYLENOL) suppository 650 mg  650 mg Rectal Q6H PRN Bobette Mo, MD       dorzolamide-timolol (COSOPT) 2-0.5 % ophthalmic solution 1 drop  1 drop Both Eyes BID Bobette Mo, MD   1 drop at 02/24/24 2108   latanoprost (XALATAN) 0.005 % ophthalmic solution 1 drop  1 drop Both Eyes QHS Bobette Mo, MD   1 drop at 02/24/24 2108   levothyroxine (SYNTHROID) tablet 75 mcg  75 mcg Oral Q0600 Nolberto Hanlon, MD   75 mcg at 02/25/24 0615   melatonin tablet 5 mg  5 mg Oral QHS PRN Lewie Chamber, MD   5 mg at 02/23/24 2214   ondansetron [facility]) tablet 4 mg  4 mg Oral Q6H PRN Bobette Mo, MD  Or   ondansetron (ZOFRAN) injection 4 mg  4 mg Intravenous Q6H PRN Bobette Mo, MD       sodium chloride flush (NS) 0.9 % injection 3 mL  3 mL Intravenous Q12H Bobette Mo, MD   3 mL at 02/24/24 2108     Discharge Medications: Please see discharge summary for a list of discharge medications.  Relevant Imaging Results:  Relevant Lab Results:   Additional Information SSN:[id number]  Valentina Shaggy Murriel Eidem, LCSW

## 2024-02-25 NOTE — Progress Notes (Signed)
 Physical Therapy Treatment Patient Details Name: Tammy Hampton MRN: [id number] DOB: 06-09-1931 Today's Date: 02/25/2024   History of Present Illness Pt is a 89 year old female preseting to ED following a near syncopal event with fall resulting in head contusion.  CT head, cervical spine is negative for acute abnormality.  Pt presenting with Periorbital contusion of right eye and edematous painful left knee (xray (-) for acute bony findings).  PMhx: glaucoma, hypertension, hypothyroidism, hyponatremia, diverticulosis, constipation, GERD, history of rectal bleeding    PT Comments  Pt agreeable to therapy, confused and needing gentle redirection to hospitalization and task at hand throughout session. Pt amb 150 ft with RW, step through gait pattern, maintains L knee flexion in stance, denies pain with gait, reports L knee sleeve "helps". Pt engages in seated LAQ and marching, L knee lacking ~15 deg from extension. Pt amb into restroom, able to engage in handwashing and self care tasks at bathroom sink with CGA for safety. Continue to recommend post acute therapy,<3 hours/day.    If plan is discharge home, recommend the following: A little help with walking and/or transfers;A little help with bathing/dressing/bathroom;Assistance with cooking/housework;Help with stairs or ramp for entrance;Supervision due to cognitive status   Can travel by private vehicle     Yes  Equipment Recommendations  Rolling walker (2 wheels)    Recommendations for Other Services       Precautions / Restrictions Precautions Precautions: Fall Precaution/Restrictions Comments: poor memory (no formal diagnoses in medical history) Required Braces or Orthoses: Other Brace Other Brace: L knee sleeve Restrictions Weight Bearing Restrictions Per Provider Order: No     Mobility  Bed Mobility               General bed mobility comments: pt in recliner on arrival    Transfers Overall transfer level: Needs  assistance Equipment used: Rolling walker (2 wheels) Transfers: Sit to/from Stand Sit to Stand: Contact guard assist           General transfer comment: verbal cues for hand placement, CGA to power up to standing, slow to rise, maintains slight L knee flexion in stance    Ambulation/Gait Ambulation/Gait assistance: Contact guard assist Gait Distance (Feet): 170 Feet Assistive device: Rolling walker (2 wheels) Gait Pattern/deviations: Step-through pattern, Decreased stride length, Knee flexed in stance - left Gait velocity: decreased     General Gait Details: step through gait pattern with L knee flexion sustained in stance, no buckling noted, L knee sleeve on throughout gait, pt denies pain, intermittent cues when navigating around obstacles in hallway and room   Stairs             Wheelchair Mobility     Tilt Bed    Modified Rankin (Stroke Patients Only)       Balance Overall balance assessment: Needs assistance, History of Falls         Standing balance support: Reliant on assistive device for balance, During functional activity, Bilateral upper extremity supported Standing balance-Leahy Scale: Fair Standing balance comment: able to release BUE to wash hands                            Communication Communication Communication: Impaired Factors Affecting Communication: Hearing impaired  Cognition Arousal: Alert Behavior During Therapy: WFL for tasks assessed/performed   PT - Cognitive impairments: Memory  PT - Cognition Comments: pt needing reorientation to being in hospital multiple times during session, pt reports being in recliner for "days" but RN reports she assisted pt up in recliner this morning, continued gentle redirection and reorientation throughout session        Cueing    Exercises General Exercises - Lower Extremity Long Arc Quad: Seated, AROM, Both, 10 reps Hip Flexion/Marching: Seated,  AROM, Both, 10 reps    General Comments        Pertinent Vitals/Pain Pain Assessment Pain Assessment: No/denies pain Pain Intervention(s): Heat applied (heat pack at bedside and pt requesting return after ambulation)    Home Living                          Prior Function            PT Goals (current goals can now be found in the care plan section) Acute Rehab PT Goals PT Goal Formulation: With patient Time For Goal Achievement: 03/08/24 Potential to Achieve Goals: Good Progress towards PT goals: Progressing toward goals    Frequency    Min 2X/week      PT Plan      Co-evaluation              AM-PAC PT "6 Clicks" Mobility   Outcome Measure  Help needed turning from your back to your side while in a flat bed without using bedrails?: A Little Help needed moving from lying on your back to sitting on the side of a flat bed without using bedrails?: A Little Help needed moving to and from a bed to a chair (including a wheelchair)?: A Little Help needed standing up from a chair using your arms (e.g., wheelchair or bedside chair)?: A Little Help needed to walk in hospital room?: A Little Help needed climbing 3-5 steps with a railing? : A Lot 6 Click Score: 17    End of Session Equipment Utilized During Treatment: Gait belt Activity Tolerance: Patient tolerated treatment well Patient left: in bed;with call bell/phone within reach;with chair alarm set Nurse Communication: Mobility status;Other (comment) (confusion) PT Visit Diagnosis: Difficulty in walking, not elsewhere classified (R26.2);Pain Pain - Right/Left: Left Pain - part of body: Knee     Time: 9528-4132 PT Time Calculation (min) (ACUTE ONLY): 27 min  Charges:    $Gait Training: 8-22 mins $Therapeutic Activity: 8-22 mins PT General Charges $$ ACUTE PT VISIT: 1 Visit                     Tori Kenaz Olafson PT, DPT 02/25/24, 10:38 AM

## 2024-02-25 NOTE — TOC Progression Note (Signed)
 Transition of Care [facility]) - Progression Note    Patient Details  Name: Tammy Hampton MRN: [id number] Date of Birth: [birth year]  [facility] CM/SW Contact  Larrie Kass, LCSW Phone Number: 02/25/2024, 2:25 PM  Clinical Narrative:     CSW received call from Candelaria Arenas with [facility] , she reports they can offer pt a bed with  approved insurance auth. CSW to start insurance auth. Auth pending. TOC to follow.   Expected Discharge Plan: Skilled Nursing Facility Barriers to Discharge: Insurance Authorization  Expected Discharge Plan and Services In-house Referral: Clinical Social Work     Living arrangements for the past 2 months: Independent Living Facility Expected Discharge Date: 02/25/24                                     Social Determinants of Health (SDOH) Interventions SDOH Screenings   Food Insecurity: No Food Insecurity (02/22/2024)  Housing: Low Risk  (02/22/2024)  Transportation Needs: No Transportation Needs (02/22/2024)  Utilities: Not At Risk (02/22/2024)  Social Connections: Moderately Integrated (02/22/2024)  Tobacco Use: Low Risk  (02/22/2024)    Readmission Risk Interventions     No data to display

## 2024-02-25 NOTE — Discharge Summary (Signed)
 Physician Discharge Summary  Tammy Hampton:[id number] DOB: August 16, 1931 DOA: 02/22/2024  PCP: Pearson Grippe, MD  Admit date: 02/22/2024 Discharge date: 02/25/2024  Admitted From: [facility] Discharge disposition: SNF   Recommendations for Outpatient Follow-Up:   Knee sleeve as needed Avoid hypotension (avoid ARBs and norvasc) Consider palliative care consult for GOC/code status   Discharge Diagnosis:   Principal Problem:   Near syncope Active Problems:   Hyponatremia   Hypertension   Hypothyroidism   Periorbital contusion of right eye   Chronic pain of left knee   Glaucoma   Aortic atherosclerosis (HCC)   Coronary atherosclerosis   Fall    Discharge Condition: Improved.  Diet recommendation: Low sodium, heart healthy.  Wound care: None.  Code status: Full.   History of Present Illness:   Tammy Hampton is a 89 y.o. female with medical history significant of glaucoma, hypertension, hypothyroidism, hyponatremia, diverticulosis, constipation, GERD, history of rectal bleeding who presented to the emergency department from her independent living facility due to having a fall several minutes after going having a bowel movement.  The patient stated that after she got out of the bathroom she went to the kitchen to get some water.  She tried to going 1 direction in, felt like her left knee buckled and fell, but she is not able to tell me all the details.  She denied loss of consciousness.  She has pain in the right periorbital area, but no nausea or emesis.  She stated that she has palpitation recently.  No fever, chills or night sweats. No recent sore throat, rhinorrhea, dyspnea, wheezing or hemoptysis.  No chest pain, diaphoresis, PND, orthopnea or pitting edema of the lower extremities.  No appetite changes, abdominal pain, diarrhea, constipation, melena or hematochezia.  No flank pain, dysuria, frequency or hematuria.  No polyuria, polydipsia, polyphagia or blurred vision.     Hospital Course by Problem:   Fall with  syncope -echo shows possible HOCM and labs some AKI so suspect patient got dehydrated and synopsized  -avoid norvasc/ACe/ARB -Correct electrolyte abnormality.       Hyponatremia -trending up     Hypertension Holding antihypertensives and would avoid ARBs/CCB in future due to echo results     Aortic atherosclerosis (HCC)   Coronary atherosclerosis No longer taking lovastatin. Follow-up with primary care provider.     Hypothyroidism -continue home meds     Periorbital contusion of right eye Analgesics as needed.     Chronic pain of left knee Physical therapy consulted-- sNF Knee sleeve     Glaucoma Continue Cosopt and Xalatan eyedrops. Follow-up with ophthalmology as an outpatient.  Dementia -at baseline per family    Medical Consultants:      Discharge Exam:   Vitals:   02/24/24 2000 02/25/24 0324  BP: (!) 127/57 (!) 148/57  Pulse: 78 72  Resp: 20 20  Temp: 98 F (36.7 C) 99.3 F (37.4 C)  SpO2: 98% 96%   Vitals:   02/24/24 1513 02/24/24 2000 02/25/24 0324 02/25/24 0500  BP: (!) 121/54 (!) 127/57 (!) 148/57   Pulse: 71 78 72   Resp: 20 20 20    Temp: 98.9 F (37.2 C) 98 F (36.7 C) 99.3 F (37.4 C)   TempSrc: Oral Oral Oral   SpO2: 98% 98% 96%   Weight:    63.5 kg  Height:        General exam: Appears calm and comfortable.    The results of significant diagnostics  from this hospitalization (including imaging, microbiology, ancillary and laboratory) are listed below for reference.     Procedures and Diagnostic Studies:   ECHOCARDIOGRAM COMPLETE Result Date: 02/22/2024    ECHOCARDIOGRAM REPORT   Patient Name:   Tammy SINGLEY Date of Exam: 02/22/2024 Medical Rec #:  [id number]         Height:       62.0 in Accession #:    [id number]        Weight:       120.0 lb Date of Birth:  1931/08/27          BSA:          1.539 m Patient Age:    89 years          BP:           127/51 mmHg Patient Gender: F                  HR:           64 bpm. Exam Location:  Inpatient Procedure: 2D Echo, Color Doppler and Cardiac Doppler (Both Spectral and Color            Flow Doppler were utilized during procedure). Indications:    R55 Syncope  History:        Patient has no prior history of Echocardiogram examinations.                 Risk Factors:Hypertension.  Sonographer:    Maxwell Marion Referring Phys: [id number] DAVID MANUEL ORTIZ IMPRESSIONS  1. Left ventricular ejection fraction, by estimation, is 60 to 65%. The left ventricle has normal function. The left ventricle has no regional wall motion abnormalities. There is moderate asymmetric left ventricular hypertrophy of the basal-septal segment. There is an LV outflow tract gradient present. Hard to separate out the LVOT signal from the MR signal, but peak gradient appears to be about 55 mmHg. Left ventricular diastolic parameters are consistent with Grade I diastolic dysfunction (impaired relaxation).  2. Right ventricular systolic function is normal. The right ventricular size is normal. There is normal pulmonary artery systolic pressure. The estimated right ventricular systolic pressure is 26.8 mmHg.  3. Left atrial size was mildly dilated.  4. There is mild mitral valve systolic anterior motion. The mitral valve is degenerative. Mild mitral valve regurgitation. Mild mitral stenosis. The mean mitral valve gradient is 5.0 mmHg.  5. The aortic valve is tricuspid. There is moderate calcification of the aortic valve. Aortic valve regurgitation is not visualized. Aortic valve sclerosis/calcification is present, without any evidence of aortic stenosis.  6. The inferior vena cava is normal in size with greater than 50% respiratory variability, suggesting right atrial pressure of 3 mmHg.  7. The patient has HOCM physiology. FINDINGS  Left Ventricle: Left ventricular ejection fraction, by estimation, is 60 to 65%. The left ventricle has normal function. The left ventricle has no regional  wall motion abnormalities. The left ventricular internal cavity size was normal in size. There is  moderate asymmetric left ventricular hypertrophy of the basal-septal segment. Left ventricular diastolic parameters are consistent with Grade I diastolic dysfunction (impaired relaxation). Right Ventricle: The right ventricular size is normal. No increase in right ventricular wall thickness. Right ventricular systolic function is normal. There is normal pulmonary artery systolic pressure. The tricuspid regurgitant velocity is 2.44 m/s, and  with an assumed right atrial pressure of 3 mmHg, the estimated right ventricular systolic pressure is 26.8 mmHg. Left Atrium: Left atrial  size was mildly dilated. Right Atrium: Right atrial size was normal in size. Pericardium: There is no evidence of pericardial effusion. Mitral Valve: There is mild mitral valve systolic anterior motion. The mitral valve is degenerative in appearance. There is moderate calcification of the mitral valve leaflet(s). Mild to moderate mitral annular calcification. Mild mitral valve regurgitation. Mild mitral valve stenosis. MV peak gradient, 11.4 mmHg. The mean mitral valve gradient is 5.0 mmHg. Tricuspid Valve: The tricuspid valve is normal in structure. Tricuspid valve regurgitation is trivial. Aortic Valve: The aortic valve is tricuspid. There is moderate calcification of the aortic valve. Aortic valve regurgitation is not visualized. Aortic valve sclerosis/calcification is present, without any evidence of aortic stenosis. Aortic valve mean gradient measures 8.0 mmHg. Aortic valve peak gradient measures 13.8 mmHg. Pulmonic Valve: The pulmonic valve was normal in structure. Pulmonic valve regurgitation is mild. Aorta: The aortic root is normal in size and structure. Venous: The inferior vena cava is normal in size with greater than 50% respiratory variability, suggesting right atrial pressure of 3 mmHg. IAS/Shunts: No atrial level shunt detected by  color flow Doppler.  LEFT VENTRICLE PLAX 2D LVIDd:         3.70 cm      Diastology LVIDs:         2.50 cm      LV e' medial:    4.03 cm/s LV PW:         1.30 cm      LV E/e' medial:  32.8 LV IVS:        1.40 cm      LV e' lateral:   5.22 cm/s LVOT diam:     1.40 cm      LV E/e' lateral: 25.3 LVOT Area:     1.54 cm  LV Volumes (MOD) LV vol d, MOD A2C: 93.7 ml LV vol d, MOD A4C: 119.0 ml LV vol s, MOD A2C: 33.1 ml LV vol s, MOD A4C: 57.3 ml LV SV MOD A2C:     60.6 ml LV SV MOD A4C:     119.0 ml LV SV MOD BP:      62.3 ml RIGHT VENTRICLE RV S prime:     15.40 cm/s TAPSE (M-mode): 2.2 cm LEFT ATRIUM             Index LA Vol (A2C):   49.2 ml 31.98 ml/m LA Vol (A4C):   39.5 ml 25.67 ml/m LA Biplane Vol: 46.8 ml 30.42 ml/m  AORTIC VALVE AV Vmax:      186.00 cm/s AV Vmean:     129.000 cm/s AV VTI:       0.450 m AV Peak Grad: 13.8 mmHg AV Mean Grad: 8.0 mmHg  AORTA Ao Asc diam: 3.70 cm MITRAL VALVE                TRICUSPID VALVE MV Area (PHT): 4.07 cm     TR Peak grad:   23.8 mmHg MV Peak grad:  11.4 mmHg    TR Vmax:        244.00 cm/s MV Mean grad:  5.0 mmHg MV Vmax:       1.69 m/s     SHUNTS MV Vmean:      108.0 cm/s   Systemic Diam: 1.40 cm MV E velocity: 132.00 cm/s MV A velocity: 157.00 cm/s MV E/A ratio:  0.84 Dalton McleanMD Electronically signed by Wilfred Lacy Signature Date/Time: 02/22/2024/3:39:34 PM    Final    CT Angio Chest PE  W/Cm &/Or Wo Cm Result Date: 02/22/2024 CLINICAL DATA:  Positive D-dimer, shortness of breath, dizziness. Pulmonary embolism suspected. EXAM: CT ANGIOGRAPHY CHEST WITH CONTRAST TECHNIQUE: Multidetector CT imaging of the chest was performed using the standard protocol during bolus administration of intravenous contrast. Multiplanar CT image reconstructions and MIPs were obtained to evaluate the vascular anatomy. RADIATION DOSE REDUCTION: This exam was performed according to the departmental dose-optimization program which includes automated exposure control, adjustment of the mA  and/or kV according to patient size and/or use of iterative reconstruction technique. CONTRAST:  60mL OMNIPAQUE IOHEXOL 350 MG/ML SOLN COMPARISON:  Most recent portable chest was today, before that PA and lateral chest 07/29/2017. Comparison is made with chest, abdomen and pelvis CT with contrast 03/12/2013. FINDINGS: Cardiovascular: There is an enlarged pulmonary trunk, 3.2 cm indicating arterial hypertension, was previously 2.7 cm. There is good arterial opacification throughout and no evidence of arterial embolus. There are patchy calcific plaques along the aorta and scattered calcification in the great vessels without aneurysm, stenosis, or dissection. There are few small foci of air in the right internal mammary vein, probably either injected with the contrast or at the time of IV insertion. No air is seen in the pulmonary arteries. There is mild-to-moderate cardiomegaly with a left chamber predominance. There is a small amount of scattered left main and 2 vessel coronary calcific plaque in the right coronary artery and LAD. The pulmonary veins are prominent superiorly. No pericardial effusion. Mediastinum/Nodes: Again seen is a very small right lobe of the thyroid, absence of the left lobe. No thyroid nodule or axillary mass. There is no intrathoracic adenopathy. The trachea and both of the main bronchi are clear. Thoracic esophagus is unremarkable.  There is a small hiatal hernia. Lungs/Pleura: Posteriorly there are hazy opacities in the lower lobes consistent with atelectasis. There is diffuse bronchial thickening. There is no consolidation, effusion or nodule. There is no pneumothorax. There is biapical scarring change. Upper Abdomen: No acute abnormality. Status post cholecystectomy. There is a 2.4 cm periampullary duodenal diverticulum. There is abdominal aortic atherosclerosis. Musculoskeletal: Osteopenia and degenerative change thoracic spine. No acute or significant osseous findings. Unremarkable chest  wall. Review of the MIP images confirms the above findings. IMPRESSION: 1. No evidence of arterial embolus. Enlarged pulmonary trunk indicating arterial hypertension. 2. Cardiomegaly with left chamber predominance. 3. Aortic and coronary artery atherosclerosis. 4. Prominent superior pulmonary veins.  No visible edema. 5. Bronchitis.  No focal infiltrate. 6. Small hiatal hernia. 7. 2.4 cm periampullary duodenal diverticulum. 8. Osteopenia and degenerative change. Electronically Signed   By: Almira Bar M.D.   On: 02/22/2024 07:02   CT Head Wo Contrast Result Date: 02/22/2024 CLINICAL DATA:  Dizziness and fall injury with right facial impact and neck pain. EXAM: CT HEAD WITHOUT CONTRAST CT CERVICAL SPINE WITHOUT CONTRAST TECHNIQUE: Multidetector CT imaging of the head and cervical spine was performed following the standard protocol without intravenous contrast. Multiplanar CT image reconstructions of the cervical spine were also generated. RADIATION DOSE REDUCTION: This exam was performed according to the departmental dose-optimization program which includes automated exposure control, adjustment of the mA and/or kV according to patient size and/or use of iterative reconstruction technique. COMPARISON:  Most recent study is MRI brain 07/21/2014. No prior neck or cervical spine CT. FINDINGS: CT HEAD FINDINGS Brain: There is mild global atrophy with atrophic ventriculomegaly and mild-to-moderate small vessel disease of the cerebral white matter. No cortical based infarct, hemorrhage, mass effect or midline shift are seen. There are calcifications  along the cortical surface in the parasagittal high right frontal lobe on series 3 axial 24 and 25. These do not appear to have been present previously. This could be due to residua from a remote interval subarachnoid bleed or infection. Vascular: Moderate patchy calcific plaque both siphons. No hyperdense central vessel is seen. Skull: Negative for fractures or focal  lesions. Swelling extends mildly over the right brow and right cheek with no other focal scalp abnormality. Sinuses/Orbits: Old lens replacements with no new orbital abnormality. Visualized sinuses and mastoid air cells are clear. Other: None. CT CERVICAL SPINE FINDINGS Alignment: Normal. Skull base and vertebrae: No acute fracture is evident. No primary bone lesion or focal pathologic process. There is osteopenia. Soft tissues and spinal canal: No prevertebral fluid or swelling. No visible canal hematoma. There are mild calcifications at the carotid bifurcations. No laryngeal or thyroid mass. Disc levels: There is degenerative disc space loss, endplate Schmorl's nodes and bidirectional endplate spurring at C5-6 and C6-7. The other discs are normal heights. No herniated discs or cord compromise are seen. There is multilevel mild facet joint spurring, along with uncinate hypertrophy causing moderate left greater than right foraminal stenosis at C5-6. The other foramina are clear. Upper chest: Biapical scarring changes. Scattered calcific plaque in the great vessels. Other: There is bone-on-bone joint space loss and remodeling of both TMJs. IMPRESSION: 1. No acute intracranial CT findings or depressed skull fractures. 2. Atrophy and small-vessel disease. 3. Calcifications along the cortical surface in the parasagittal high right frontal lobe which do not appear to have been present on 2015 MRI. This could be due to residua from a remote interval subarachnoid bleed or infection. 4. Osteopenia and degenerative change without evidence of cervical fractures. 5. Great vessel and carotid atherosclerosis. 6. Bilateral TMJ arthropathy. Electronically Signed   By: Almira Bar M.D.   On: 02/22/2024 06:43   CT Cervical Spine Wo Contrast Result Date: 02/22/2024 CLINICAL DATA:  Dizziness and fall injury with right facial impact and neck pain. EXAM: CT HEAD WITHOUT CONTRAST CT CERVICAL SPINE WITHOUT CONTRAST TECHNIQUE:  Multidetector CT imaging of the head and cervical spine was performed following the standard protocol without intravenous contrast. Multiplanar CT image reconstructions of the cervical spine were also generated. RADIATION DOSE REDUCTION: This exam was performed according to the departmental dose-optimization program which includes automated exposure control, adjustment of the mA and/or kV according to patient size and/or use of iterative reconstruction technique. COMPARISON:  Most recent study is MRI brain 07/21/2014. No prior neck or cervical spine CT. FINDINGS: CT HEAD FINDINGS Brain: There is mild global atrophy with atrophic ventriculomegaly and mild-to-moderate small vessel disease of the cerebral white matter. No cortical based infarct, hemorrhage, mass effect or midline shift are seen. There are calcifications along the cortical surface in the parasagittal high right frontal lobe on series 3 axial 24 and 25. These do not appear to have been present previously. This could be due to residua from a remote interval subarachnoid bleed or infection. Vascular: Moderate patchy calcific plaque both siphons. No hyperdense central vessel is seen. Skull: Negative for fractures or focal lesions. Swelling extends mildly over the right brow and right cheek with no other focal scalp abnormality. Sinuses/Orbits: Old lens replacements with no new orbital abnormality. Visualized sinuses and mastoid air cells are clear. Other: None. CT CERVICAL SPINE FINDINGS Alignment: Normal. Skull base and vertebrae: No acute fracture is evident. No primary bone lesion or focal pathologic process. There is osteopenia. Soft tissues and spinal  canal: No prevertebral fluid or swelling. No visible canal hematoma. There are mild calcifications at the carotid bifurcations. No laryngeal or thyroid mass. Disc levels: There is degenerative disc space loss, endplate Schmorl's nodes and bidirectional endplate spurring at C5-6 and C6-7. The other discs  are normal heights. No herniated discs or cord compromise are seen. There is multilevel mild facet joint spurring, along with uncinate hypertrophy causing moderate left greater than right foraminal stenosis at C5-6. The other foramina are clear. Upper chest: Biapical scarring changes. Scattered calcific plaque in the great vessels. Other: There is bone-on-bone joint space loss and remodeling of both TMJs. IMPRESSION: 1. No acute intracranial CT findings or depressed skull fractures. 2. Atrophy and small-vessel disease. 3. Calcifications along the cortical surface in the parasagittal high right frontal lobe which do not appear to have been present on 2015 MRI. This could be due to residua from a remote interval subarachnoid bleed or infection. 4. Osteopenia and degenerative change without evidence of cervical fractures. 5. Great vessel and carotid atherosclerosis. 6. Bilateral TMJ arthropathy. Electronically Signed   By: Almira Bar M.D.   On: 02/22/2024 06:43   DG Chest Port 1 View Result Date: 02/22/2024 CLINICAL DATA:  Fall with shortness of breath. EXAM: PORTABLE CHEST 1 VIEW COMPARISON:  07/29/2017 FINDINGS: Low volume film. The cardio pericardial silhouette is enlarged. Vascular congestion noted. Diffuse interstitial prominence may be chronic although a component of interstitial edema is not excluded. No pneumothorax or pleural effusion. Bones are diffusely demineralized. Telemetry leads overlie the chest. IMPRESSION: Low volume film with vascular congestion and possible interstitial edema. Electronically Signed   By: Kennith Center M.D.   On: 02/22/2024 05:41   DG Knee Complete 4 Views Left Result Date: 02/22/2024 CLINICAL DATA:  Fall.  Pain and swelling to the left knee. EXAM: LEFT KNEE - COMPLETE 4+ VIEW COMPARISON:  None Available. FINDINGS: No evidence for an acute fracture. No subluxation or dislocation. Marked degenerative changes are noted in all 3 compartments with complete loss of joint space in  the medial compartment. Marked hypertrophic spurring is seen in the medial and patellofemoral compartments with more modest spurring in the lateral compartment. There is calcification of the lateral meniscus. Multiple mineralized intra-articular loose bodies are seen within the joint space anteriorly and posteriorly with evidence of joint effusion. IMPRESSION: 1. No acute bony findings. 2. Marked degenerative changes in all 3 compartments with complete loss of joint space in the medial compartment. 3. Multiple mineralized intra-articular loose bodies. Electronically Signed   By: Kennith Center M.D.   On: 02/22/2024 05:34     Labs:   Basic Metabolic Panel: Recent Labs  Lab 02/22/24 0517 02/22/24 1658 02/23/24 0412 02/24/24 0356 02/25/24 0345  NA 130*  --  130* 128* 134*  K 3.9  --  3.5 3.2* 4.0  CL 98  --  99 100 105  CO2 21*  --  19* 19* 21*  GLUCOSE 147*  --  96 112* 112*  BUN 31*  --  30* 34* 27*  CREATININE 1.21*  --  1.09* 1.12* 1.11*  CALCIUM 8.7*  --  8.2* 8.1* 8.6*  MG  --  2.4  --   --   --   PHOS  --  3.3  --   --   --    GFR Estimated Creatinine Clearance: 28.9 mL/min (A) (by C-G formula based on SCr of 1.11 mg/dL (H)). Liver Function Tests: Recent Labs  Lab 02/22/24 0517 02/23/24 0412  AST  25 20  ALT 16 15  ALKPHOS 65 55  BILITOT 1.6* 1.1  PROT 7.2 6.3*  ALBUMIN 3.9 3.3*   No results for input(s): "LIPASE", "AMYLASE" in the last 168 hours. No results for input(s): "AMMONIA" in the last 168 hours. Coagulation profile No results for input(s): "INR", "PROTIME" in the last 168 hours.  CBC: Recent Labs  Lab 02/22/24 0517 02/23/24 0412 02/24/24 0356 02/25/24 0345  WBC 11.5* 11.1* 7.5 7.3  NEUTROABS 9.7*  --   --   --   HGB 11.0* 10.1* 9.5* 10.3*  HCT 33.3* 31.0* 28.4* 30.9*  MCV 96.8 99.7 96.9 97.5  PLT 209 178 173 219   Cardiac Enzymes: No results for input(s): "CKTOTAL", "CKMB", "CKMBINDEX", "TROPONINI" in the last 168 hours. BNP: Invalid input(s):  "POCBNP" CBG: Recent Labs  Lab 02/23/24 0506 02/24/24 0441 02/25/24 0412  GLUCAP 109* 104* 128*   D-Dimer No results for input(s): "DDIMER" in the last 72 hours. Hgb A1c No results for input(s): "HGBA1C" in the last 72 hours. Lipid Profile No results for input(s): "CHOL", "HDL", "LDLCALC", "TRIG", "CHOLHDL", "LDLDIRECT" in the last 72 hours. Thyroid function studies Recent Labs    02/22/24 1658  TSH 1.563   Anemia work up No results for input(s): "VITAMINB12", "FOLATE", "FERRITIN", "TIBC", "IRON", "RETICCTPCT" in the last 72 hours. Microbiology No results found for this or any previous visit (from the past 240 hours).   Discharge Instructions:   Discharge Instructions     Diet - low sodium heart healthy   Complete by: As directed    Increase activity slowly   Complete by: As directed       Allergies as of 02/25/2024       Reactions   Codeine Other (See Comments)   Reaction not recalled   Macrobid [nitrofurantoin Monohyd Macro] Itching   Macrolides And Ketolides Itching   Oxycodone-acetaminophen Other (See Comments)   Reaction not recalled   Troleandomycin Other (See Comments)   Reaction not recalled/noted   Zithromax [azithromycin] Itching   Penicillins Rash, Other (See Comments)   Sulfa Antibiotics Rash        Medication List     STOP taking these medications    losartan 50 MG tablet Commonly known as: COZAAR       TAKE these medications    acetaminophen 325 MG tablet Commonly known as: TYLENOL Take 2 tablets (650 mg total) by mouth every 6 (six) hours as needed for mild pain (pain score 1-3) (or Fever >/= 101).   Biofreeze Roll-On 4 % Gel Generic drug: Menthol (Topical Analgesic) Apply 1 application  topically 2 (two) times daily as needed (for pain- to affected areas).   Co Q-10 100 MG Caps Take 100 mg by mouth daily.   dorzolamide-timolol 2-0.5 % ophthalmic solution Commonly known as: COSOPT Place 1 drop into both eyes 2 (two) times  daily.   latanoprost 0.005 % ophthalmic solution Commonly known as: XALATAN Place 1 drop into both eyes at bedtime.   levothyroxine 75 MCG tablet Commonly known as: SYNTHROID Take 75 mcg by mouth daily before breakfast.   melatonin 5 MG Tabs Take 1 tablet (5 mg total) by mouth at bedtime as needed.   Vitamin D3 50 MCG (2000 UT) Tabs Take 2,000 Units by mouth daily.          Time coordinating discharge: 45 min  Signed:  Joseph Art DO  [facility] 02/25/2024, 1:26 PM

## 2024-02-25 NOTE — TOC Progression Note (Addendum)
 Transition of Care [facility]) - Progression Note    Patient Details  Name: Tammy Hampton MRN: [id number] Date of Birth: 03/20/1931  Transition [facility] Care [facility] CM/SW Contact  Larrie Kass, LCSW Phone Number: 02/25/2024, 10:10 AM  Clinical Narrative:     CSW spoke with the pt's nephew, Tammy Hampton, to discuss recommendations for SNF placement. Mr. Tammy Hampton has agreed and would like the pt to return to [facility]. CSW explained the process, noting that the pt will need insurance authorization to go to rehab. CSW will contact and send the pt's information to [facility] for SNF placement. TOC to follow  ADDEN 11:30am Attempted to call [facility] admissions no response Left VM requesting return call and sent text. TOC to follow.   Barriers to Discharge: Continued Medical Work up  Expected Discharge Plan and Services In-house Referral: Clinical Social Work     Living arrangements for the past 2 months: Independent [employer]                                       Social Determinants of Health (SDOH) Interventions SDOH Screenings   Food Insecurity: No Food Insecurity (02/22/2024)  Housing: Low Risk  (02/22/2024)  Transportation Needs: No Transportation Needs (02/22/2024)  Utilities: Not At Risk (02/22/2024)  Social Connections: Moderately Integrated (02/22/2024)  Tobacco Use: Low Risk  (02/22/2024)    Readmission Risk Interventions     No data to display

## 2024-02-26 DIAGNOSIS — R55 Syncope and collapse: Secondary | ICD-10-CM | POA: Diagnosis not present

## 2024-02-26 LAB — GLUCOSE, CAPILLARY: Glucose-Capillary: 118 mg/dL — ABNORMAL HIGH (ref 70–99)

## 2024-02-26 MED ORDER — POLYETHYLENE GLYCOL 3350 17 G PO PACK
17.0000 g | PACK | Freq: Every day | ORAL | Status: DC
  Administered 2024-02-26: 17 g via ORAL
  Filled 2024-02-26: qty 1

## 2024-02-26 NOTE — Discharge Summary (Signed)
 Physician Discharge Summary  Tammy Hampton ZOX:[id number] DOB: 1931/08/30 DOA: 02/22/2024  PCP: Pearson Grippe, MD  Admit date: 02/22/2024 Discharge date: 02/26/2024  Admitted From: [facility]F Discharge disposition: SNF   Recommendations for Outpatient Follow-Up:   Knee sleeve as needed Avoid hypotension (avoid ARBs and norvasc) Consider palliative care consult for GOC/code status vs outpatient cards follow up for ? HOCM   Discharge Diagnosis:   Principal Problem:   Near syncope Active Problems:   Hyponatremia   Hypertension   Hypothyroidism   Periorbital contusion of right eye   Chronic pain of left knee   Glaucoma   Aortic atherosclerosis (HCC)   Coronary atherosclerosis   Fall    Discharge Condition: Improved.  Diet recommendation: Low sodium, heart healthy.  Wound care: None.  Code status: Full.   History of Present Illness:   Tammy Hampton is a 89 y.o. female with medical history significant of glaucoma, hypertension, hypothyroidism, hyponatremia, diverticulosis, constipation, GERD, history of rectal bleeding who presented to the emergency department from her independent living facility due to having a fall several minutes after going having a bowel movement.  The patient stated that after she got out of the bathroom she went to the kitchen to get some water.  She tried to going 1 direction in, felt like her left knee buckled and fell, but she is not able to tell me all the details.  She denied loss of consciousness.  She has pain in the right periorbital area, but no nausea or emesis.  She stated that she has palpitation recently.  No fever, chills or night sweats. No recent sore throat, rhinorrhea, dyspnea, wheezing or hemoptysis.  No chest pain, diaphoresis, PND, orthopnea or pitting edema of the lower extremities.  No appetite changes, abdominal pain, diarrhea, constipation, melena or hematochezia.  No flank pain, dysuria, frequency or hematuria.  No polyuria,  polydipsia, polyphagia or blurred vision.    Hospital Course by Problem:   Fall with  syncope -echo shows possible HOCM and labs some AKI so suspect patient got dehydrated and synopsized  -avoid norvasc/ACe/ARB -Correct electrolyte abnormality.       Hyponatremia -trending up     Hypertension Holding antihypertensives and would avoid ARBs/CCB in future due to echo results     Aortic atherosclerosis (HCC)   Coronary atherosclerosis No longer taking lovastatin. Follow-up with primary care provider.     Hypothyroidism -continue home meds     Periorbital contusion of right eye Analgesics as needed.     Chronic pain of left knee Physical therapy consulted-- sNF Knee sleeve     Glaucoma Continue Cosopt and Xalatan eyedrops. Follow-up with ophthalmology as an outpatient.  Dementia -at baseline per family    Medical Consultants:      Discharge Exam:   Vitals:   02/25/24 2006 02/26/24 0508  BP: 135/73 (!) 145/62  Pulse: 79 68  Resp: 18 18  Temp: 98.3 F (36.8 C) 97.7 F (36.5 C)  SpO2: 99% 98%   Vitals:   02/25/24 1326 02/25/24 2006 02/26/24 0500 02/26/24 0508  BP: (!) 153/65 135/73  (!) 145/62  Pulse: 67 79  68  Resp: 18 18  18   Temp: 98.4 F (36.9 C) 98.3 F (36.8 C)  97.7 F (36.5 C)  TempSrc: Oral Oral  Oral  SpO2: 100% 99%  98%  Weight:   64.1 kg   Height:        General exam: Appears calm and comfortable.  The results of significant diagnostics from this hospitalization (including imaging, microbiology, ancillary and laboratory) are listed below for reference.     Procedures and Diagnostic Studies:   ECHOCARDIOGRAM COMPLETE Result Date: 02/22/2024    ECHOCARDIOGRAM REPORT   Patient Name:   Tammy Hampton Date of Exam: 02/22/2024 Medical Rec #:  [id number]         Height:       62.0 in Accession #:    [id number]        Weight:       120.0 lb Date of Birth:  03-07-1931          BSA:          1.539 m Patient Age:    89 years          BP:            127/51 mmHg Patient Gender: F                 HR:           64 bpm. Exam Location:  Inpatient Procedure: 2D Echo, Color Doppler and Cardiac Doppler (Both Spectral and Color            Flow Doppler were utilized during procedure). Indications:    R55 Syncope  History:        Patient has no prior history of Echocardiogram examinations.                 Risk Factors:Hypertension.  Sonographer:    Maxwell Marion Referring Phys: [id number] DAVID MANUEL ORTIZ IMPRESSIONS  1. Left ventricular ejection fraction, by estimation, is 60 to 65%. The left ventricle has normal function. The left ventricle has no regional wall motion abnormalities. There is moderate asymmetric left ventricular hypertrophy of the basal-septal segment. There is an LV outflow tract gradient present. Hard to separate out the LVOT signal from the MR signal, but peak gradient appears to be about 55 mmHg. Left ventricular diastolic parameters are consistent with Grade I diastolic dysfunction (impaired relaxation).  2. Right ventricular systolic function is normal. The right ventricular size is normal. There is normal pulmonary artery systolic pressure. The estimated right ventricular systolic pressure is 26.8 mmHg.  3. Left atrial size was mildly dilated.  4. There is mild mitral valve systolic anterior motion. The mitral valve is degenerative. Mild mitral valve regurgitation. Mild mitral stenosis. The mean mitral valve gradient is 5.0 mmHg.  5. The aortic valve is tricuspid. There is moderate calcification of the aortic valve. Aortic valve regurgitation is not visualized. Aortic valve sclerosis/calcification is present, without any evidence of aortic stenosis.  6. The inferior vena cava is normal in size with greater than 50% respiratory variability, suggesting right atrial pressure of 3 mmHg.  7. The patient has HOCM physiology. FINDINGS  Left Ventricle: Left ventricular ejection fraction, by estimation, is 60 to 65%. The left ventricle has normal  function. The left ventricle has no regional wall motion abnormalities. The left ventricular internal cavity size was normal in size. There is  moderate asymmetric left ventricular hypertrophy of the basal-septal segment. Left ventricular diastolic parameters are consistent with Grade I diastolic dysfunction (impaired relaxation). Right Ventricle: The right ventricular size is normal. No increase in right ventricular wall thickness. Right ventricular systolic function is normal. There is normal pulmonary artery systolic pressure. The tricuspid regurgitant velocity is 2.44 m/s, and  with an assumed right atrial pressure of 3 mmHg, the estimated right ventricular systolic pressure is 26.8  mmHg. Left Atrium: Left atrial size was mildly dilated. Right Atrium: Right atrial size was normal in size. Pericardium: There is no evidence of pericardial effusion. Mitral Valve: There is mild mitral valve systolic anterior motion. The mitral valve is degenerative in appearance. There is moderate calcification of the mitral valve leaflet(s). Mild to moderate mitral annular calcification. Mild mitral valve regurgitation. Mild mitral valve stenosis. MV peak gradient, 11.4 mmHg. The mean mitral valve gradient is 5.0 mmHg. Tricuspid Valve: The tricuspid valve is normal in structure. Tricuspid valve regurgitation is trivial. Aortic Valve: The aortic valve is tricuspid. There is moderate calcification of the aortic valve. Aortic valve regurgitation is not visualized. Aortic valve sclerosis/calcification is present, without any evidence of aortic stenosis. Aortic valve mean gradient measures 8.0 mmHg. Aortic valve peak gradient measures 13.8 mmHg. Pulmonic Valve: The pulmonic valve was normal in structure. Pulmonic valve regurgitation is mild. Aorta: The aortic root is normal in size and structure. Venous: The inferior vena cava is normal in size with greater than 50% respiratory variability, suggesting right atrial pressure of 3 mmHg.  IAS/Shunts: No atrial level shunt detected by color flow Doppler.  LEFT VENTRICLE PLAX 2D LVIDd:         3.70 cm      Diastology LVIDs:         2.50 cm      LV e' medial:    4.03 cm/s LV PW:         1.30 cm      LV E/e' medial:  32.8 LV IVS:        1.40 cm      LV e' lateral:   5.22 cm/s LVOT diam:     1.40 cm      LV E/e' lateral: 25.3 LVOT Area:     1.54 cm  LV Volumes (MOD) LV vol d, MOD A2C: 93.7 ml LV vol d, MOD A4C: 119.0 ml LV vol s, MOD A2C: 33.1 ml LV vol s, MOD A4C: 57.3 ml LV SV MOD A2C:     60.6 ml LV SV MOD A4C:     119.0 ml LV SV MOD BP:      62.3 ml RIGHT VENTRICLE RV S prime:     15.40 cm/s TAPSE (M-mode): 2.2 cm LEFT ATRIUM             Index LA Vol (A2C):   49.2 ml 31.98 ml/m LA Vol (A4C):   39.5 ml 25.67 ml/m LA Biplane Vol: 46.8 ml 30.42 ml/m  AORTIC VALVE AV Vmax:      186.00 cm/s AV Vmean:     129.000 cm/s AV VTI:       0.450 m AV Peak Grad: 13.8 mmHg AV Mean Grad: 8.0 mmHg  AORTA Ao Asc diam: 3.70 cm MITRAL VALVE                TRICUSPID VALVE MV Area (PHT): 4.07 cm     TR Peak grad:   23.8 mmHg MV Peak grad:  11.4 mmHg    TR Vmax:        244.00 cm/s MV Mean grad:  5.0 mmHg MV Vmax:       1.69 m/s     SHUNTS MV Vmean:      108.0 cm/s   Systemic Diam: 1.40 cm MV E velocity: 132.00 cm/s MV A velocity: 157.00 cm/s MV E/A ratio:  0.84 Dalton McleanMD Electronically signed by Wilfred Lacy Signature Date/Time: 02/22/2024/3:39:34 PM    Final  CT Angio Chest PE W/Cm &/Or Wo Cm Result Date: 02/22/2024 CLINICAL DATA:  Positive D-dimer, shortness of breath, dizziness. Pulmonary embolism suspected. EXAM: CT ANGIOGRAPHY CHEST WITH CONTRAST TECHNIQUE: Multidetector CT imaging of the chest was performed using the standard protocol during bolus administration of intravenous contrast. Multiplanar CT image reconstructions and MIPs were obtained to evaluate the vascular anatomy. RADIATION DOSE REDUCTION: This exam was performed according to the departmental dose-optimization program which includes  automated exposure control, adjustment of the mA and/or kV according to patient size and/or use of iterative reconstruction technique. CONTRAST:  60mL OMNIPAQUE IOHEXOL 350 MG/ML SOLN COMPARISON:  Most recent portable chest was today, before that PA and lateral chest 07/29/2017. Comparison is made with chest, abdomen and pelvis CT with contrast 03/12/2013. FINDINGS: Cardiovascular: There is an enlarged pulmonary trunk, 3.2 cm indicating arterial hypertension, was previously 2.7 cm. There is good arterial opacification throughout and no evidence of arterial embolus. There are patchy calcific plaques along the aorta and scattered calcification in the great vessels without aneurysm, stenosis, or dissection. There are few small foci of air in the right internal mammary vein, probably either injected with the contrast or at the time of IV insertion. No air is seen in the pulmonary arteries. There is mild-to-moderate cardiomegaly with a left chamber predominance. There is a small amount of scattered left main and 2 vessel coronary calcific plaque in the right coronary artery and LAD. The pulmonary veins are prominent superiorly. No pericardial effusion. Mediastinum/Nodes: Again seen is a very small right lobe of the thyroid, absence of the left lobe. No thyroid nodule or axillary mass. There is no intrathoracic adenopathy. The trachea and both of the main bronchi are clear. Thoracic esophagus is unremarkable.  There is a small hiatal hernia. Lungs/Pleura: Posteriorly there are hazy opacities in the lower lobes consistent with atelectasis. There is diffuse bronchial thickening. There is no consolidation, effusion or nodule. There is no pneumothorax. There is biapical scarring change. Upper Abdomen: No acute abnormality. Status post cholecystectomy. There is a 2.4 cm periampullary duodenal diverticulum. There is abdominal aortic atherosclerosis. Musculoskeletal: Osteopenia and degenerative change thoracic spine. No acute or  significant osseous findings. Unremarkable chest wall. Review of the MIP images confirms the above findings. IMPRESSION: 1. No evidence of arterial embolus. Enlarged pulmonary trunk indicating arterial hypertension. 2. Cardiomegaly with left chamber predominance. 3. Aortic and coronary artery atherosclerosis. 4. Prominent superior pulmonary veins.  No visible edema. 5. Bronchitis.  No focal infiltrate. 6. Small hiatal hernia. 7. 2.4 cm periampullary duodenal diverticulum. 8. Osteopenia and degenerative change. Electronically Signed   By: Almira Bar M.D.   On: 02/22/2024 07:02   CT Head Wo Contrast Result Date: 02/22/2024 CLINICAL DATA:  Dizziness and fall injury with right facial impact and neck pain. EXAM: CT HEAD WITHOUT CONTRAST CT CERVICAL SPINE WITHOUT CONTRAST TECHNIQUE: Multidetector CT imaging of the head and cervical spine was performed following the standard protocol without intravenous contrast. Multiplanar CT image reconstructions of the cervical spine were also generated. RADIATION DOSE REDUCTION: This exam was performed according to the departmental dose-optimization program which includes automated exposure control, adjustment of the mA and/or kV according to patient size and/or use of iterative reconstruction technique. COMPARISON:  Most recent study is MRI brain 07/21/2014. No prior neck or cervical spine CT. FINDINGS: CT HEAD FINDINGS Brain: There is mild global atrophy with atrophic ventriculomegaly and mild-to-moderate small vessel disease of the cerebral white matter. No cortical based infarct, hemorrhage, mass effect or midline shift are  seen. There are calcifications along the cortical surface in the parasagittal high right frontal lobe on series 3 axial 24 and 25. These do not appear to have been present previously. This could be due to residua from a remote interval subarachnoid bleed or infection. Vascular: Moderate patchy calcific plaque both siphons. No hyperdense central vessel is  seen. Skull: Negative for fractures or focal lesions. Swelling extends mildly over the right brow and right cheek with no other focal scalp abnormality. Sinuses/Orbits: Old lens replacements with no new orbital abnormality. Visualized sinuses and mastoid air cells are clear. Other: None. CT CERVICAL SPINE FINDINGS Alignment: Normal. Skull base and vertebrae: No acute fracture is evident. No primary bone lesion or focal pathologic process. There is osteopenia. Soft tissues and spinal canal: No prevertebral fluid or swelling. No visible canal hematoma. There are mild calcifications at the carotid bifurcations. No laryngeal or thyroid mass. Disc levels: There is degenerative disc space loss, endplate Schmorl's nodes and bidirectional endplate spurring at C5-6 and C6-7. The other discs are normal heights. No herniated discs or cord compromise are seen. There is multilevel mild facet joint spurring, along with uncinate hypertrophy causing moderate left greater than right foraminal stenosis at C5-6. The other foramina are clear. Upper chest: Biapical scarring changes. Scattered calcific plaque in the great vessels. Other: There is bone-on-bone joint space loss and remodeling of both TMJs. IMPRESSION: 1. No acute intracranial CT findings or depressed skull fractures. 2. Atrophy and small-vessel disease. 3. Calcifications along the cortical surface in the parasagittal high right frontal lobe which do not appear to have been present on 2015 MRI. This could be due to residua from a remote interval subarachnoid bleed or infection. 4. Osteopenia and degenerative change without evidence of cervical fractures. 5. Great vessel and carotid atherosclerosis. 6. Bilateral TMJ arthropathy. Electronically Signed   By: Almira Bar M.D.   On: 02/22/2024 06:43   CT Cervical Spine Wo Contrast Result Date: 02/22/2024 CLINICAL DATA:  Dizziness and fall injury with right facial impact and neck pain. EXAM: CT HEAD WITHOUT CONTRAST CT  CERVICAL SPINE WITHOUT CONTRAST TECHNIQUE: Multidetector CT imaging of the head and cervical spine was performed following the standard protocol without intravenous contrast. Multiplanar CT image reconstructions of the cervical spine were also generated. RADIATION DOSE REDUCTION: This exam was performed according to the departmental dose-optimization program which includes automated exposure control, adjustment of the mA and/or kV according to patient size and/or use of iterative reconstruction technique. COMPARISON:  Most recent study is MRI brain 07/21/2014. No prior neck or cervical spine CT. FINDINGS: CT HEAD FINDINGS Brain: There is mild global atrophy with atrophic ventriculomegaly and mild-to-moderate small vessel disease of the cerebral white matter. No cortical based infarct, hemorrhage, mass effect or midline shift are seen. There are calcifications along the cortical surface in the parasagittal high right frontal lobe on series 3 axial 24 and 25. These do not appear to have been present previously. This could be due to residua from a remote interval subarachnoid bleed or infection. Vascular: Moderate patchy calcific plaque both siphons. No hyperdense central vessel is seen. Skull: Negative for fractures or focal lesions. Swelling extends mildly over the right brow and right cheek with no other focal scalp abnormality. Sinuses/Orbits: Old lens replacements with no new orbital abnormality. Visualized sinuses and mastoid air cells are clear. Other: None. CT CERVICAL SPINE FINDINGS Alignment: Normal. Skull base and vertebrae: No acute fracture is evident. No primary bone lesion or focal pathologic process. There is osteopenia.  Soft tissues and spinal canal: No prevertebral fluid or swelling. No visible canal hematoma. There are mild calcifications at the carotid bifurcations. No laryngeal or thyroid mass. Disc levels: There is degenerative disc space loss, endplate Schmorl's nodes and bidirectional endplate  spurring at C5-6 and C6-7. The other discs are normal heights. No herniated discs or cord compromise are seen. There is multilevel mild facet joint spurring, along with uncinate hypertrophy causing moderate left greater than right foraminal stenosis at C5-6. The other foramina are clear. Upper chest: Biapical scarring changes. Scattered calcific plaque in the great vessels. Other: There is bone-on-bone joint space loss and remodeling of both TMJs. IMPRESSION: 1. No acute intracranial CT findings or depressed skull fractures. 2. Atrophy and small-vessel disease. 3. Calcifications along the cortical surface in the parasagittal high right frontal lobe which do not appear to have been present on 2015 MRI. This could be due to residua from a remote interval subarachnoid bleed or infection. 4. Osteopenia and degenerative change without evidence of cervical fractures. 5. Great vessel and carotid atherosclerosis. 6. Bilateral TMJ arthropathy. Electronically Signed   By: Almira Bar M.D.   On: 02/22/2024 06:43   DG Chest Port 1 View Result Date: 02/22/2024 CLINICAL DATA:  Fall with shortness of breath. EXAM: PORTABLE CHEST 1 VIEW COMPARISON:  07/29/2017 FINDINGS: Low volume film. The cardio pericardial silhouette is enlarged. Vascular congestion noted. Diffuse interstitial prominence may be chronic although a component of interstitial edema is not excluded. No pneumothorax or pleural effusion. Bones are diffusely demineralized. Telemetry leads overlie the chest. IMPRESSION: Low volume film with vascular congestion and possible interstitial edema. Electronically Signed   By: Kennith Center M.D.   On: 02/22/2024 05:41   DG Knee Complete 4 Views Left Result Date: 02/22/2024 CLINICAL DATA:  Fall.  Pain and swelling to the left knee. EXAM: LEFT KNEE - COMPLETE 4+ VIEW COMPARISON:  None Available. FINDINGS: No evidence for an acute fracture. No subluxation or dislocation. Marked degenerative changes are noted in all 3  compartments with complete loss of joint space in the medial compartment. Marked hypertrophic spurring is seen in the medial and patellofemoral compartments with more modest spurring in the lateral compartment. There is calcification of the lateral meniscus. Multiple mineralized intra-articular loose bodies are seen within the joint space anteriorly and posteriorly with evidence of joint effusion. IMPRESSION: 1. No acute bony findings. 2. Marked degenerative changes in all 3 compartments with complete loss of joint space in the medial compartment. 3. Multiple mineralized intra-articular loose bodies. Electronically Signed   By: Kennith Center M.D.   On: 02/22/2024 05:34     Labs:   Basic Metabolic Panel: Recent Labs  Lab 02/22/24 0517 02/22/24 1658 02/23/24 0412 02/24/24 0356 02/25/24 0345  NA 130*  --  130* 128* 134*  K 3.9  --  3.5 3.2* 4.0  CL 98  --  99 100 105  CO2 21*  --  19* 19* 21*  GLUCOSE 147*  --  96 112* 112*  BUN 31*  --  30* 34* 27*  CREATININE 1.21*  --  1.09* 1.12* 1.11*  CALCIUM 8.7*  --  8.2* 8.1* 8.6*  MG  --  2.4  --   --   --   PHOS  --  3.3  --   --   --    GFR Estimated Creatinine Clearance: 29 mL/min (A) (by C-G formula based on SCr of 1.11 mg/dL (H)). Liver Function Tests: Recent Labs  Lab 02/22/24 [phone]  02/23/24 0412  AST 25 20  ALT 16 15  ALKPHOS 65 55  BILITOT 1.6* 1.1  PROT 7.2 6.3*  ALBUMIN 3.9 3.3*   No results for input(s): "LIPASE", "AMYLASE" in the last 168 hours. No results for input(s): "AMMONIA" in the last 168 hours. Coagulation profile No results for input(s): "INR", "PROTIME" in the last 168 hours.  CBC: Recent Labs  Lab 02/22/24 0517 02/23/24 0412 02/24/24 0356 02/25/24 0345  WBC 11.5* 11.1* 7.5 7.3  NEUTROABS 9.7*  --   --   --   HGB 11.0* 10.1* 9.5* 10.3*  HCT 33.3* 31.0* 28.4* 30.9*  MCV 96.8 99.7 96.9 97.5  PLT 209 178 173 219   Cardiac Enzymes: No results for input(s): "CKTOTAL", "CKMB", "CKMBINDEX", "TROPONINI"  in the last 168 hours. BNP: Invalid input(s): "POCBNP" CBG: Recent Labs  Lab 02/23/24 0506 02/24/24 0441 02/25/24 0412 02/26/24 0505  GLUCAP 109* 104* 128* 118*   D-Dimer No results for input(s): "DDIMER" in the last 72 hours. Hgb A1c No results for input(s): "HGBA1C" in the last 72 hours. Lipid Profile No results for input(s): "CHOL", "HDL", "LDLCALC", "TRIG", "CHOLHDL", "LDLDIRECT" in the last 72 hours. Thyroid function studies No results for input(s): "TSH", "T4TOTAL", "T3FREE", "THYROIDAB" in the last 72 hours.  Invalid input(s): "FREET3"  Anemia work up No results for input(s): "VITAMINB12", "FOLATE", "FERRITIN", "TIBC", "IRON", "RETICCTPCT" in the last 72 hours. Microbiology No results found for this or any previous visit (from the past 240 hours).   Discharge Instructions:   Discharge Instructions     Diet - low sodium heart healthy   Complete by: As directed    Increase activity slowly   Complete by: As directed       Allergies as of 02/26/2024       Reactions   Codeine Other (See Comments)   Reaction not recalled   Macrobid [nitrofurantoin Monohyd Macro] Itching   Macrolides And Ketolides Itching   Oxycodone-acetaminophen Other (See Comments)   Reaction not recalled   Troleandomycin Other (See Comments)   Reaction not recalled/noted   Zithromax [azithromycin] Itching   Penicillins Rash, Other (See Comments)   Sulfa Antibiotics Rash        Medication List     STOP taking these medications    losartan 50 MG tablet Commonly known as: COZAAR       TAKE these medications    acetaminophen 325 MG tablet Commonly known as: TYLENOL Take 2 tablets (650 mg total) by mouth every 6 (six) hours as needed for mild pain (pain score 1-3) (or Fever >/= 101).   Biofreeze Roll-On 4 % Gel Generic drug: Menthol (Topical Analgesic) Apply 1 application  topically 2 (two) times daily as needed (for pain- to affected areas).   Co Q-10 100 MG Caps Take 100  mg by mouth daily.   dorzolamide-timolol 2-0.5 % ophthalmic solution Commonly known as: COSOPT Place 1 drop into both eyes 2 (two) times daily.   latanoprost 0.005 % ophthalmic solution Commonly known as: XALATAN Place 1 drop into both eyes at bedtime.   levothyroxine 75 MCG tablet Commonly known as: SYNTHROID Take 75 mcg by mouth daily before breakfast.   melatonin 5 MG Tabs Take 1 tablet (5 mg total) by mouth at bedtime as needed.   Vitamin D3 50 MCG (2000 UT) Tabs Take 2,000 Units by mouth daily.          Time coordinating discharge: 45 min  Signed:  Joseph Art DO  [facility]  02/26/2024, 1:04 PM

## 2024-02-26 NOTE — Discharge Planning (Signed)
 Report called to [place], LPN @ [facility] [address]

## 2024-02-26 NOTE — Progress Notes (Signed)
 PROGRESS NOTE    Tammy Hampton  NWG:[id number] DOB: February 23, 1931 DOA: 02/22/2024 PCP: Pearson Grippe, MD    Brief Narrative:  Tammy Hampton is a 89 y.o. female with medical history significant of glaucoma, hypertension, hypothyroidism, hyponatremia, diverticulosis, constipation, GERD, history of rectal bleeding who presented to the emergency department from her independent living facility due to having a fall several minutes after going having a bowel movement.  The patient stated that after she got out of the bathroom she went to the kitchen to get some water.  She tried to going 1 direction in, felt like her left knee buckled and fell, but she is not able to tell me all the details.  She denied loss of consciousness.  From [facility]-- PT recommended SNF but insurance denied so TOC to arrange dispo  Assessment and Plan:  Fall with  syncope -echo shows HOCM and labs showed AKI-- suspect patient got dehydrated and passed out -avoid norvasc/ARB -Corrected electrolyte abnormality. -PT eval-- SNF but insurance denied      Hyponatremia -trending up     Hypertension Holding antihypertensives     Aortic atherosclerosis (HCC)   Coronary atherosclerosis No longer taking lovastatin. Follow-up with primary care provider.     Hypothyroidism -continue home meds     Periorbital contusion of right eye Analgesics as needed. SCDs for DVT prophylaxis.     Chronic pain of left knee Physical therapy consulted. -sleeve ordered     Glaucoma Continue Cosopt and Xalatan eyedrops. Follow-up with ophthalmology as an outpatient.  DVT prophylaxis: SCDs Start: 02/22/24 0839    Code Status: Full Code Family Communication: family at bedside 4/7  Disposition Plan:  Level of care: Med-Surg Status is: Observation     Consultants:  PT   Subjective: Some constipation  Objective: Vitals:   02/25/24 1326 02/25/24 2006 02/26/24 0500 02/26/24 0508  BP: (!) 153/65 135/73  (!) 145/62   Pulse: 67 79  68  Resp: 18 18  18   Temp: 98.4 F (36.9 C) 98.3 F (36.8 C)  97.7 F (36.5 C)  TempSrc: Oral Oral  Oral  SpO2: 100% 99%  98%  Weight:   64.1 kg   Height:       No intake or output data in the 24 hours ending 02/26/24 1225  Filed Weights   02/24/24 0500 02/25/24 0500 02/26/24 0500  Weight: 63.8 kg 63.5 kg 64.1 kg    Examination:    General: Appearance:     Overweight female in no acute distress   Bruising around eye  Lungs:    respirations unlabored  Heart:    Normal heart rate. .   MS:   All extremities are intact.   Neurologic:   Awake, alert       Data Reviewed: I have personally reviewed following labs and imaging studies  CBC: Recent Labs  Lab 02/22/24 0517 02/23/24 0412 02/24/24 0356 02/25/24 0345  WBC 11.5* 11.1* 7.5 7.3  NEUTROABS 9.7*  --   --   --   HGB 11.0* 10.1* 9.5* 10.3*  HCT 33.3* 31.0* 28.4* 30.9*  MCV 96.8 99.7 96.9 97.5  PLT 209 178 173 219   Basic Metabolic Panel: Recent Labs  Lab 02/22/24 0517 02/22/24 1658 02/23/24 0412 02/24/24 0356 02/25/24 0345  NA 130*  --  130* 128* 134*  K 3.9  --  3.5 3.2* 4.0  CL 98  --  99 100 105  CO2 21*  --  19* 19* 21*  GLUCOSE 147*  --  96 112* 112*  BUN 31*  --  30* 34* 27*  CREATININE 1.21*  --  1.09* 1.12* 1.11*  CALCIUM 8.7*  --  8.2* 8.1* 8.6*  MG  --  2.4  --   --   --   PHOS  --  3.3  --   --   --    GFR: Estimated Creatinine Clearance: 29 mL/min (A) (by C-G formula based on SCr of 1.11 mg/dL (H)). Liver Function Tests: Recent Labs  Lab 02/22/24 0517 02/23/24 0412  AST 25 20  ALT 16 15  ALKPHOS 65 55  BILITOT 1.6* 1.1  PROT 7.2 6.3*  ALBUMIN 3.9 3.3*   No results for input(s): "LIPASE", "AMYLASE" in the last 168 hours. No results for input(s): "AMMONIA" in the last 168 hours. Coagulation Profile: No results for input(s): "INR", "PROTIME" in the last 168 hours. Cardiac Enzymes: No results for input(s): "CKTOTAL", "CKMB", "CKMBINDEX", "TROPONINI" in the last  168 hours. BNP (last 3 results) No results for input(s): "PROBNP" in the last 8760 hours. HbA1C: No results for input(s): "HGBA1C" in the last 72 hours. CBG: Recent Labs  Lab 02/23/24 0506 02/24/24 0441 02/25/24 0412 02/26/24 0505  GLUCAP 109* 104* 128* 118*   Lipid Profile: No results for input(s): "CHOL", "HDL", "LDLCALC", "TRIG", "CHOLHDL", "LDLDIRECT" in the last 72 hours. Thyroid Function Tests: No results for input(s): "TSH", "T4TOTAL", "FREET4", "T3FREE", "THYROIDAB" in the last 72 hours.  Anemia Panel: No results for input(s): "VITAMINB12", "FOLATE", "FERRITIN", "TIBC", "IRON", "RETICCTPCT" in the last 72 hours. Sepsis Labs: No results for input(s): "PROCALCITON", "LATICACIDVEN" in the last 168 hours.  No results found for this or any previous visit (from the past 240 hours).       Radiology Studies: No results found.       Scheduled Meds:  dorzolamide-timolol  1 drop Both Eyes BID   latanoprost  1 drop Both Eyes QHS   levothyroxine  75 mcg Oral Q0600   polyethylene glycol  17 g Oral Daily   sodium chloride flush  3 mL Intravenous Q12H   Continuous Infusions:   LOS: 0 days    Time spent: 45 minutes spent on chart review, discussion with nursing staff, consultants, updating family and interview/physical exam; more than 50% of that time was spent in counseling and/or coordination of care.    Joseph Art, DO [facility] Available via Epic secure chat 7am-7pm After these hours, please refer to coverage provider listed on [email or web address] 02/26/2024, 12:25 PM

## 2024-02-26 NOTE — TOC Progression Note (Addendum)
 Transition of Care [facility]) - Progression Note    Patient Details  Name: Tammy Hampton MRN: [id number] Date of Birth: [birth year]  [facility] CM/SW Contact  Larrie Kass, LCSW Phone Number: 02/26/2024, 9:44 AM  Clinical Narrative:     Pt's insurance authorization is pending. TOC to follow. a Peer to Peer for SNF  was offered [phone] option 5. Must be completed by 3pm today. MD made aware. TOC to follow.   Expected Discharge Plan: Skilled Nursing Facility Barriers to Discharge: Insurance Authorization  Expected Discharge Plan and Services In-house Referral: Clinical Social Work     Living arrangements for the past 2 months: Independent Living Facility Expected Discharge Date: 02/25/24                                     Social Determinants of Health (SDOH) Interventions SDOH Screenings   Food Insecurity: No Food Insecurity (02/22/2024)  Housing: Low Risk  (02/22/2024)  Transportation Needs: No Transportation Needs (02/22/2024)  Utilities: Not At Risk (02/22/2024)  Social Connections: Moderately Integrated (02/22/2024)  Tobacco Use: Low Risk  (02/22/2024)    Readmission Risk Interventions     No data to display

## 2024-02-26 NOTE — TOC Transition Note (Signed)
 Transition of Care [facility]) - Discharge Note   Patient Details  Name: Tammy Hampton MRN: [id number] Date of Birth: 1931-07-02  Transition of Care [facility]) CM/SW Contact:  Larrie Kass, LCSW Phone Number: 02/26/2024, 1:17 PM   Clinical Narrative:     Pt's insurance Berkley Harvey was denied for SNF placement. CSW spoke with Kenard Gower with [employer] , pt can d/c to facility Private pay. She stated the facility will provided transportation. Pt's family was notified and agrees with d/c plan. RN to call report to [phone].no further TOC needs, TOC sign off.  Final next level of care: Skilled Nursing Facility Barriers to Discharge: No Barriers Identified   Patient Goals and CMS Choice Patient states their goals for this hospitalization and ongoing recovery are:: SNf          Discharge Placement                  Name of family member notified: keiyana, stehr (Niece)  [phone] (Mobile Patient and family notified of of transfer: 02/26/24  Discharge Plan and Services Additional resources added to the After Visit Summary for   In-house Referral: Clinical Social Work                                   Social Drivers of Health (SDOH) Interventions SDOH Screenings   Food Insecurity: No Food Insecurity (02/22/2024)  Housing: Low Risk  (02/22/2024)  Transportation Needs: No Transportation Needs (02/22/2024)  Utilities: Not At Risk (02/22/2024)  Social Connections: Moderately Integrated (02/22/2024)  Tobacco Use: Low Risk  (02/22/2024)     Readmission Risk Interventions     No data to display

## 2024-05-17 ENCOUNTER — Emergency Department (HOSPITAL_COMMUNITY)

## 2024-05-17 ENCOUNTER — Emergency Department (HOSPITAL_COMMUNITY)
Admission: EM | Admit: 2024-05-17 | Discharge: 2024-05-17 | Disposition: A | Discharge status: Home / Self Care | Attending: Emergency Medicine | Admitting: Emergency Medicine

## 2024-05-17 ENCOUNTER — Encounter (HOSPITAL_COMMUNITY): Payer: Self-pay

## 2024-05-17 DIAGNOSIS — M25531 Pain in right wrist: Secondary | ICD-10-CM | POA: Insufficient documentation

## 2024-05-17 DIAGNOSIS — R55 Syncope and collapse: Secondary | ICD-10-CM | POA: Diagnosis not present

## 2024-05-17 DIAGNOSIS — I10 Essential (primary) hypertension: Secondary | ICD-10-CM | POA: Diagnosis not present

## 2024-05-17 DIAGNOSIS — R109 Unspecified abdominal pain: Secondary | ICD-10-CM | POA: Diagnosis not present

## 2024-05-17 DIAGNOSIS — I2510 Atherosclerotic heart disease of native coronary artery without angina pectoris: Secondary | ICD-10-CM | POA: Insufficient documentation

## 2024-05-17 LAB — CBC
HCT: 32.8 % — ABNORMAL LOW (ref 36.0–46.0)
Hemoglobin: 11 g/dL — ABNORMAL LOW (ref 12.0–15.0)
MCH: 32.4 pg (ref 26.0–34.0)
MCHC: 33.5 g/dL (ref 30.0–36.0)
MCV: 96.8 fL (ref 80.0–100.0)
Platelets: 191 10*3/uL (ref 150–400)
RBC: 3.39 MIL/uL — ABNORMAL LOW (ref 3.87–5.11)
RDW: 12.4 % (ref 11.5–15.5)
WBC: 10.3 10*3/uL (ref 4.0–10.5)
nRBC: 0 % (ref 0.0–0.2)

## 2024-05-17 LAB — COMPREHENSIVE METABOLIC PANEL WITH GFR
ALT: 13 U/L (ref 0–44)
AST: 19 U/L (ref 15–41)
Albumin: 3.7 g/dL (ref 3.5–5.0)
Alkaline Phosphatase: 58 U/L (ref 38–126)
Anion gap: 10 (ref 5–15)
BUN: 21 mg/dL (ref 8–23)
CO2: 22 mmol/L (ref 22–32)
Calcium: 8.9 mg/dL (ref 8.9–10.3)
Chloride: 103 mmol/L (ref 98–111)
Creatinine, Ser: 1.02 mg/dL — ABNORMAL HIGH (ref 0.44–1.00)
GFR, Estimated: 52 mL/min — ABNORMAL LOW (ref >60)
Glucose, Bld: 124 mg/dL — ABNORMAL HIGH (ref 70–99)
Potassium: 4.2 mmol/L (ref 3.5–5.1)
Sodium: 135 mmol/L (ref 135–145)
Total Bilirubin: 0.7 mg/dL (ref 0.0–1.2)
Total Protein: 6.8 g/dL (ref 6.5–8.1)

## 2024-05-17 LAB — TROPONIN I (HIGH SENSITIVITY): Troponin I (High Sensitivity): 10 ng/L (ref <18)

## 2024-05-17 MED ORDER — DOXYCYCLINE HYCLATE 100 MG PO CAPS: 100.0000 mg | ORAL_CAPSULE | Freq: Two times a day (BID) | ORAL | 0 refills | Status: AC

## 2024-05-17 MED ORDER — DOXYCYCLINE HYCLATE 100 MG PO TABS
100.0000 mg | ORAL_TABLET | Freq: Once | ORAL | Status: AC
  Administered 2024-05-17: 100 mg via ORAL
  Filled 2024-05-17: qty 1

## 2024-05-17 MED ORDER — NAPROXEN 250 MG PO TABS
500.0000 mg | ORAL_TABLET | Freq: Once | ORAL | Status: AC
  Administered 2024-05-17: 500 mg via ORAL
  Filled 2024-05-17: qty 2

## 2024-05-17 NOTE — ED Notes (Signed)
 Please update linda, number under emergency contacts

## 2024-05-17 NOTE — ED Triage Notes (Signed)
 Pt arrives EMS from [facility] independent facility with reports of right wrist pain and swelling over the last  few days. Per EMS pt had a 20sec syncopal episode with staff. Pt arrives alert and oriented x4 with no recollection of event. CBG 150. Pt denies any other symptoms at present.

## 2024-05-17 NOTE — ED Provider Notes (Signed)
 [facility]PT [facility] Emergency Department Provider Note MRN:  [id number]  Arrival date & time: 05/17/24     Chief Complaint   Wrist Pain and Near Syncope   History of Present Illness   Tammy Hampton is a 89 y.o. year-old female with a history of hypertension presenting to the ED with chief complaint of wrist pain.  Pain and swelling in the right wrist for the past several days.  Syncopal episode with staff this evening.  Patient denies recollection of the event, denies any preceding symptoms or warning.  Currently without symptoms other than soreness to the right wrist as well as the left knee.  Review of Systems  A thorough review of systems was obtained and all systems are negative except as noted in the HPI and PMH.   Patient's Health History    Past Medical History:  Diagnosis Date   Glaucoma    Hypertension    Mitral valve prolapse     Past Surgical History:  Procedure Laterality Date   ABDOMINAL HYSTERECTOMY     APPENDECTOMY     BREAST EXCISIONAL BIOPSY     left 1990 right 1964   CHOLECYSTECTOMY     THYROIDECTOMY  1967    History reviewed. No pertinent family history.  Social History   Socioeconomic History   Marital status: Married    Spouse name: Not on file   Number of children: Not on file   Years of education: Not on file   Highest education level: Not on file  Occupational History   Not on file  Tobacco Use   Smoking status: Never   Smokeless tobacco: Never  Substance and Sexual Activity   Alcohol use: No   Drug use: No   Sexual activity: Not on file  Other Topics Concern   Not on file  Social History Narrative   Not on file   Social Drivers of Health   Financial Resource Strain: Not on file  Food Insecurity: No Food Insecurity (02/22/2024)   Hunger Vital Sign    Worried About Running Out of Food in the Last Year: Never true    Ran Out of Food in the Last Year: Never true  Transportation Needs: No Transportation Needs  (02/22/2024)   PRAPARE - Administrator, Civil Service (Medical): No    Lack of Transportation (Non-Medical): No  Physical Activity: Not on file  Stress: Not on file  Social Connections: Moderately Integrated (02/22/2024)   Social Connection and Isolation Panel    Frequency of Communication with Friends and Family: More than three times a week    Frequency of Social Gatherings with Friends and Family: More than three times a week    Attends Religious Services: More than 4 times per year    Active Member of [employer] or Organizations: Yes    Attends Banker Meetings: More than 4 times per year    Marital Status: Widowed  Intimate Partner Violence: Not At Risk (02/22/2024)   Humiliation, Afraid, Rape, and Kick questionnaire    Fear of Current or Ex-Partner: No    Emotionally Abused: No    Physically Abused: No    Sexually Abused: No     Physical Exam   Vitals:   05/17/24 0342 05/17/24 0600  BP: (!) 150/52 (!) 145/67  Pulse: 69 73  Resp: 18 12  Temp: 97.6 F (36.4 C)   SpO2: 100% 99%    CONSTITUTIONAL: Well-appearing, NAD NEURO/PSYCH:  Alert and oriented x  3, no focal deficits EYES:  eyes equal and reactive ENT/NECK:  no LAD, no JVD CARDIO: Regular rate, well-perfused, normal S1 and S2 PULM:  CTAB no wheezing or rhonchi GI/GU:  non-distended, non-tender MSK/SPINE:  No gross deformities, no edema SKIN:  no rash, atraumatic   *Additional and/or pertinent findings included in MDM below  Diagnostic and Interventional Summary    EKG Interpretation Date/Time:  Saturday May 17 2024 03:50:05 EDT Ventricular Rate:  66 PR Interval:  61 QRS Duration:  155 QT Interval:  471 QTC Calculation: 494 R Axis:   -15  Text Interpretation: Sinus or ectopic atrial rhythm Short PR interval IVCD, consider atypical LBBB Confirmed by Theadore Sharper [phone]) on 05/17/2024 4:00:20 AM       Labs Reviewed  CBC - Abnormal; Notable for the following components:      Result  Value   RBC 3.39 (*)    Hemoglobin 11.0 (*)    HCT 32.8 (*)    All other components within normal limits  COMPREHENSIVE METABOLIC PANEL WITH GFR - Abnormal; Notable for the following components:   Glucose, Bld 124 (*)    Creatinine, Ser 1.02 (*)    GFR, Estimated 52 (*)    All other components within normal limits  TROPONIN I (HIGH SENSITIVITY)    DG Chest Port 1 View  Final Result    DG Wrist Complete Right  Final Result    DG Knee Complete 4 Views Left  Final Result      Medications  doxycycline (VIBRA-TABS) tablet 100 mg (has no administration in time range)  naproxen (NAPROSYN) tablet 500 mg (has no administration in time range)     Procedures  /  Critical Care Procedures  ED Course and Medical Decision Making  Initial Impression and Ddx Syncope without prodrome, 89 year old with a history of hypertension, CAD but no history of heart failure.  Vital signs are reassuring.  No acute distress and largely no symptoms at this time other than orthopedic concerns.  Her right wrist appears chronically deformed though she denies history of rheumatoid arthritis.  Range of motion is largely preserved, there is no increased warmth or erythema, overall doubt septic joint.  More pain is elicited with range of motion of the left knee.  Awaiting x-rays, labs.  Past medical/surgical history that increases complexity of ED encounter: Hypertension  Interpretation of Diagnostics I personally reviewed the Laboratory Testing and my interpretation is as follows: No significant blood count or electrolyte disturbance.  Troponin negative  EKG unchanged from prior, left bundle branch block  Patient Reassessment and Ultimate Disposition/Management     Patient with no arrhythmia during 3 hours of cardiac monitoring, not having any cardiopulmonary symptoms.  I think that admission for syncope is low yield at this point.  Admission offered but patient prefers going home following up closely with  PCP.  Regarding the wrist, there is some erythema to the hand distal to the wrist and so may be she has developed a cellulitis.  But the wrist itself has reassuring range of motion and so doubt septic joint.  Will send home with antibiotics.  Patient management required discussion with the following services or consulting groups:  None  Complexity of Problems Addressed Acute illness or injury that poses threat of life of bodily function  Additional Data Reviewed and Analyzed Further history obtained from: Prior labs/imaging results  Additional Factors Impacting ED Encounter Risk Prescriptions and Consideration of hospitalization  Sharper HERO. Theadore, MD [facility]  [facility] [email or web address]  Final Clinical Impressions(s) / ED Diagnoses     ICD-10-CM   1. Right wrist pain  M25.531       ED Discharge Orders          Ordered    doxycycline (VIBRAMYCIN) 100 MG capsule  2 times daily        05/17/24 0656             Discharge Instructions Discussed with and Provided to Patient:     Discharge Instructions      You were evaluated in the Emergency Department and after careful evaluation, we did not find any emergent condition requiring admission or further testing in the hospital.  Your exam/testing today was overall reassuring.  Recommend close follow-up with your primary care doctor to discuss your passing out spell.  You may have a skin infection on your wrist.  Take the doxycycline antibiotic as directed.  Use Tylenol  at home for pain.  Please return to the Emergency Department if you experience any worsening of your condition.  Thank you for allowing us  to be a part of your care.        Theadore Ozell HERO, MD 05/17/24 [phone]

## 2024-05-17 NOTE — Discharge Instructions (Signed)
 You were evaluated in the Emergency Department and after careful evaluation, we did not find any emergent condition requiring admission or further testing in the hospital.  Your exam/testing today was overall reassuring.  Recommend close follow-up with your primary care doctor to discuss your passing out spell.  You may have a skin infection on your wrist.  Take the doxycycline antibiotic as directed.  Use Tylenol  at home for pain.  Please return to the Emergency Department if you experience any worsening of your condition.  Thank you for allowing us  to be a part of your care.
